# Patient Record
Sex: Female | Born: 1950 | Race: White | Hispanic: No | Smoking: Never smoker
Health system: Southern US, Community
[De-identification: ages and names within clinical notes are randomized; demographics above are authoritative.]

## PROBLEM LIST (undated history)

## (undated) DIAGNOSIS — F419 Anxiety disorder, unspecified: Secondary | ICD-10-CM

## (undated) DIAGNOSIS — I4891 Unspecified atrial fibrillation: Secondary | ICD-10-CM

## (undated) DIAGNOSIS — N200 Calculus of kidney: Secondary | ICD-10-CM

## (undated) DIAGNOSIS — K648 Other hemorrhoids: Secondary | ICD-10-CM

## (undated) DIAGNOSIS — D126 Benign neoplasm of colon, unspecified: Secondary | ICD-10-CM

## (undated) DIAGNOSIS — F341 Dysthymic disorder: Secondary | ICD-10-CM

## (undated) DIAGNOSIS — I10 Essential (primary) hypertension: Secondary | ICD-10-CM

## (undated) DIAGNOSIS — K5790 Diverticulosis of intestine, part unspecified, without perforation or abscess without bleeding: Secondary | ICD-10-CM

## (undated) DIAGNOSIS — K8020 Calculus of gallbladder without cholecystitis without obstruction: Secondary | ICD-10-CM

## (undated) DIAGNOSIS — E785 Hyperlipidemia, unspecified: Secondary | ICD-10-CM

## (undated) HISTORY — DX: Calculus of kidney: N20.0

## (undated) HISTORY — DX: Diverticulosis of intestine, part unspecified, without perforation or abscess without bleeding: K57.90

## (undated) HISTORY — DX: Hyperlipidemia, unspecified: E78.5

## (undated) HISTORY — PX: HERNIA REPAIR: SHX51

## (undated) HISTORY — DX: Dysthymic disorder: F34.1

## (undated) HISTORY — PX: CHOLECYSTECTOMY: SHX55

## (undated) HISTORY — PX: APPENDECTOMY: SHX54

## (undated) HISTORY — DX: Other hemorrhoids: K64.8

## (undated) HISTORY — DX: Calculus of gallbladder without cholecystitis without obstruction: K80.20

## (undated) HISTORY — DX: Anxiety disorder, unspecified: F41.9

## (undated) HISTORY — DX: Benign neoplasm of colon, unspecified: D12.6

---

## 1996-08-29 DIAGNOSIS — K8020 Calculus of gallbladder without cholecystitis without obstruction: Secondary | ICD-10-CM

## 1996-08-29 HISTORY — DX: Calculus of gallbladder without cholecystitis without obstruction: K80.20

## 2016-06-23 DIAGNOSIS — I10 Essential (primary) hypertension: Secondary | ICD-10-CM | POA: Diagnosis not present

## 2016-06-23 DIAGNOSIS — F419 Anxiety disorder, unspecified: Secondary | ICD-10-CM | POA: Diagnosis not present

## 2016-06-23 DIAGNOSIS — F331 Major depressive disorder, recurrent, moderate: Secondary | ICD-10-CM | POA: Diagnosis not present

## 2016-06-23 DIAGNOSIS — G4700 Insomnia, unspecified: Secondary | ICD-10-CM | POA: Diagnosis not present

## 2016-06-23 DIAGNOSIS — Z0000 Encounter for general adult medical examination without abnormal findings: Secondary | ICD-10-CM | POA: Diagnosis not present

## 2016-06-23 DIAGNOSIS — Z23 Encounter for immunization: Secondary | ICD-10-CM | POA: Diagnosis not present

## 2016-07-19 DIAGNOSIS — H5213 Myopia, bilateral: Secondary | ICD-10-CM | POA: Diagnosis not present

## 2016-10-24 DIAGNOSIS — I10 Essential (primary) hypertension: Secondary | ICD-10-CM | POA: Diagnosis not present

## 2016-10-24 DIAGNOSIS — E7800 Pure hypercholesterolemia, unspecified: Secondary | ICD-10-CM | POA: Diagnosis not present

## 2016-10-27 DIAGNOSIS — I4891 Unspecified atrial fibrillation: Secondary | ICD-10-CM | POA: Diagnosis not present

## 2016-10-27 DIAGNOSIS — Z6839 Body mass index (BMI) 39.0-39.9, adult: Secondary | ICD-10-CM | POA: Diagnosis not present

## 2016-10-27 DIAGNOSIS — I10 Essential (primary) hypertension: Secondary | ICD-10-CM | POA: Diagnosis not present

## 2016-11-07 DIAGNOSIS — I10 Essential (primary) hypertension: Secondary | ICD-10-CM | POA: Diagnosis not present

## 2016-11-07 DIAGNOSIS — E7800 Pure hypercholesterolemia, unspecified: Secondary | ICD-10-CM | POA: Diagnosis not present

## 2016-11-07 DIAGNOSIS — G4700 Insomnia, unspecified: Secondary | ICD-10-CM | POA: Diagnosis not present

## 2016-11-07 DIAGNOSIS — F331 Major depressive disorder, recurrent, moderate: Secondary | ICD-10-CM | POA: Diagnosis not present

## 2017-05-28 ENCOUNTER — Emergency Department (HOSPITAL_COMMUNITY)
Admission: EM | Admit: 2017-05-28 | Discharge: 2017-05-28 | Disposition: A | Discharge status: Home / Self Care | Payer: Medicare Other | Attending: Emergency Medicine | Admitting: Emergency Medicine

## 2017-05-28 ENCOUNTER — Encounter (HOSPITAL_COMMUNITY): Payer: Self-pay | Admitting: Emergency Medicine

## 2017-05-28 ENCOUNTER — Emergency Department (HOSPITAL_COMMUNITY): Payer: Medicare Other

## 2017-05-28 DIAGNOSIS — R1011 Right upper quadrant pain: Secondary | ICD-10-CM | POA: Insufficient documentation

## 2017-05-28 DIAGNOSIS — R1010 Upper abdominal pain, unspecified: Secondary | ICD-10-CM

## 2017-05-28 DIAGNOSIS — Z79899 Other long term (current) drug therapy: Secondary | ICD-10-CM | POA: Diagnosis not present

## 2017-05-28 DIAGNOSIS — K8020 Calculus of gallbladder without cholecystitis without obstruction: Secondary | ICD-10-CM | POA: Diagnosis not present

## 2017-05-28 DIAGNOSIS — I10 Essential (primary) hypertension: Secondary | ICD-10-CM | POA: Diagnosis not present

## 2017-05-28 HISTORY — DX: Unspecified atrial fibrillation: I48.91

## 2017-05-28 HISTORY — DX: Essential (primary) hypertension: I10

## 2017-05-28 LAB — CBC
HCT: 32.2 % — ABNORMAL LOW (ref 36.0–46.0)
Hemoglobin: 10.7 g/dL — ABNORMAL LOW (ref 12.0–15.0)
MCH: 30.9 pg (ref 26.0–34.0)
MCHC: 33.2 g/dL (ref 30.0–36.0)
MCV: 93.1 fL (ref 78.0–100.0)
PLATELETS: 454 10*3/uL — AB (ref 150–400)
RBC: 3.46 MIL/uL — ABNORMAL LOW (ref 3.87–5.11)
RDW: 12.5 % (ref 11.5–15.5)
WBC: 17.3 10*3/uL — AB (ref 4.0–10.5)

## 2017-05-28 LAB — COMPREHENSIVE METABOLIC PANEL
ALK PHOS: 116 U/L (ref 38–126)
ALT: 23 U/L (ref 14–54)
AST: 26 U/L (ref 15–41)
Albumin: 3.3 g/dL — ABNORMAL LOW (ref 3.5–5.0)
Anion gap: 13 (ref 5–15)
BUN: 20 mg/dL (ref 6–20)
CHLORIDE: 94 mmol/L — AB (ref 101–111)
CO2: 28 mmol/L (ref 22–32)
CREATININE: 1.57 mg/dL — AB (ref 0.44–1.00)
Calcium: 9.5 mg/dL (ref 8.9–10.3)
GFR calc Af Amer: 39 mL/min — ABNORMAL LOW (ref >60)
GFR, EST NON AFRICAN AMERICAN: 33 mL/min — AB (ref >60)
Glucose, Bld: 135 mg/dL — ABNORMAL HIGH (ref 65–99)
Potassium: 3.2 mmol/L — ABNORMAL LOW (ref 3.5–5.1)
Sodium: 135 mmol/L (ref 135–145)
Total Bilirubin: 0.8 mg/dL (ref 0.3–1.2)
Total Protein: 7.8 g/dL (ref 6.5–8.1)

## 2017-05-28 LAB — LIPASE, BLOOD: LIPASE: 30 U/L (ref 11–51)

## 2017-05-28 MED ORDER — ONDANSETRON 4 MG PO TBDP: 4.0000 mg | ORAL_TABLET | Freq: Three times a day (TID) | ORAL | 0 refills | Status: DC | PRN

## 2017-05-28 MED ORDER — HYDROCODONE-ACETAMINOPHEN 5-325 MG PO TABS: 1.0000 | ORAL_TABLET | ORAL | 0 refills | Status: DC | PRN

## 2017-05-28 MED ORDER — SODIUM CHLORIDE 0.9 % IV BOLUS (SEPSIS)
1000.0000 mL | Freq: Once | INTRAVENOUS | Status: AC
  Administered 2017-05-28: 1000 mL via INTRAVENOUS

## 2017-05-28 NOTE — ED Notes (Signed)
Pt returned to room  

## 2017-05-28 NOTE — ED Notes (Signed)
Patient transported to CT 

## 2017-05-28 NOTE — Discharge Instructions (Addendum)
Your CT and Ultrasound show a mass under the liver.  If your gallbladder WAS removed in 1998, this is concerning for possible malignancy.  If your gallbladder WASN'T removed in 1998, this could be a very abnormal appearing gallbladder.  The next appropriate test to further clarify this mass is an MRI of the abdomen. This has been ordered.  Please call your Primary Care Physician to make an appointment as soon as possible to discuss the results of this MRI.  If you become worse: Pain, fever, jaundice, vomiting, other symptoms, please return to emergency room.  You will need to have your medical records from the hospital in [place]. If they arrived tonight after you leave, we will contact you to pick them up. If they do not arrive you'll need to contact medical records at that hospital and request a discharge summary, and operative report.  Vicodin as needed for pain, Zofran as needed for nausea.  Bland low-fat diet.

## 2017-05-28 NOTE — ED Triage Notes (Signed)
Pt reports RUQ pain x4 days, went to urgent care and was sent in for further eval. Denies any associated symptoms, reports some fevers at home. Resp e/u, nad.

## 2017-05-28 NOTE — ED Notes (Signed)
Pt attempted to void..the patient says she voided but missed the cp... I told pt to let me know when she has to void again and we will catch her urine with a "hat"

## 2017-06-05 ENCOUNTER — Ambulatory Visit (HOSPITAL_COMMUNITY)
Admission: RE | Admit: 2017-06-05 | Discharge: 2017-06-05 | Disposition: A | Discharge status: Home / Self Care | Payer: Medicare Other | Source: Ambulatory Visit | Attending: Family Medicine | Admitting: Family Medicine

## 2017-06-05 ENCOUNTER — Encounter: Payer: Self-pay | Admitting: Family Medicine

## 2017-06-05 ENCOUNTER — Ambulatory Visit (INDEPENDENT_AMBULATORY_CARE_PROVIDER_SITE_OTHER): Payer: Medicare Other | Admitting: Family Medicine

## 2017-06-05 VITALS — BP 122/68 | HR 91 | Temp 98.3°F | Ht 66.0 in | Wt 205.8 lb

## 2017-06-05 DIAGNOSIS — I481 Persistent atrial fibrillation: Secondary | ICD-10-CM

## 2017-06-05 DIAGNOSIS — K862 Cyst of pancreas: Secondary | ICD-10-CM | POA: Insufficient documentation

## 2017-06-05 DIAGNOSIS — I10 Essential (primary) hypertension: Secondary | ICD-10-CM

## 2017-06-05 DIAGNOSIS — Z23 Encounter for immunization: Secondary | ICD-10-CM | POA: Diagnosis not present

## 2017-06-05 DIAGNOSIS — I4819 Other persistent atrial fibrillation: Secondary | ICD-10-CM

## 2017-06-05 DIAGNOSIS — R1011 Right upper quadrant pain: Secondary | ICD-10-CM

## 2017-06-05 DIAGNOSIS — D1803 Hemangioma of intra-abdominal structures: Secondary | ICD-10-CM | POA: Diagnosis not present

## 2017-06-05 DIAGNOSIS — E785 Hyperlipidemia, unspecified: Secondary | ICD-10-CM

## 2017-06-05 DIAGNOSIS — N281 Cyst of kidney, acquired: Secondary | ICD-10-CM | POA: Diagnosis not present

## 2017-06-05 DIAGNOSIS — K7689 Other specified diseases of liver: Secondary | ICD-10-CM | POA: Diagnosis not present

## 2017-06-05 DIAGNOSIS — R935 Abnormal findings on diagnostic imaging of other abdominal regions, including retroperitoneum: Secondary | ICD-10-CM | POA: Insufficient documentation

## 2017-06-05 DIAGNOSIS — F419 Anxiety disorder, unspecified: Secondary | ICD-10-CM

## 2017-06-05 DIAGNOSIS — Z9049 Acquired absence of other specified parts of digestive tract: Secondary | ICD-10-CM | POA: Diagnosis not present

## 2017-06-05 DIAGNOSIS — F341 Dysthymic disorder: Secondary | ICD-10-CM | POA: Insufficient documentation

## 2017-06-05 DIAGNOSIS — E782 Mixed hyperlipidemia: Secondary | ICD-10-CM | POA: Insufficient documentation

## 2017-06-05 HISTORY — DX: Dysthymic disorder: F34.1

## 2017-06-05 HISTORY — DX: Hyperlipidemia, unspecified: E78.5

## 2017-06-05 HISTORY — DX: Anxiety disorder, unspecified: F41.9

## 2017-06-05 LAB — COMPREHENSIVE METABOLIC PANEL
ALT: 16 U/L (ref 0–35)
AST: 15 U/L (ref 0–37)
Albumin: 3.7 g/dL (ref 3.5–5.2)
Alkaline Phosphatase: 113 U/L (ref 39–117)
BUN: 22 mg/dL (ref 6–23)
CO2: 32 mEq/L (ref 19–32)
Calcium: 9.9 mg/dL (ref 8.4–10.5)
Chloride: 91 mEq/L — ABNORMAL LOW (ref 96–112)
Creatinine, Ser: 1.21 mg/dL — ABNORMAL HIGH (ref 0.40–1.20)
GFR: 47.3 mL/min — ABNORMAL LOW (ref >60.00)
Glucose, Bld: 104 mg/dL — ABNORMAL HIGH (ref 70–99)
Potassium: 3.9 mEq/L (ref 3.5–5.1)
Sodium: 135 mEq/L (ref 135–145)
Total Bilirubin: 0.5 mg/dL (ref 0.2–1.2)
Total Protein: 7.6 g/dL (ref 6.0–8.3)

## 2017-06-05 LAB — CBC WITH DIFFERENTIAL/PLATELET
Basophils Absolute: 0 10*3/uL (ref 0.0–0.1)
Basophils Relative: 0.3 % (ref 0.0–3.0)
Eosinophils Absolute: 0.1 10*3/uL (ref 0.0–0.7)
Eosinophils Relative: 0.4 % (ref 0.0–5.0)
HCT: 32.3 % — ABNORMAL LOW (ref 36.0–46.0)
Hemoglobin: 11.2 g/dL — ABNORMAL LOW (ref 12.0–15.0)
Lymphocytes Relative: 14.1 % (ref 12.0–46.0)
Lymphs Abs: 2.2 10*3/uL (ref 0.7–4.0)
MCHC: 34.6 g/dL (ref 30.0–36.0)
MCV: 93.6 fl (ref 78.0–100.0)
Monocytes Absolute: 1.1 10*3/uL — ABNORMAL HIGH (ref 0.1–1.0)
Monocytes Relative: 7.2 % (ref 3.0–12.0)
Neutro Abs: 12.2 10*3/uL — ABNORMAL HIGH (ref 1.4–7.7)
Neutrophils Relative %: 78 % — ABNORMAL HIGH (ref 43.0–77.0)
Platelets: 631 10*3/uL — ABNORMAL HIGH (ref 150.0–400.0)
RBC: 3.45 Mil/uL — ABNORMAL LOW (ref 3.87–5.11)
RDW: 12.7 % (ref 11.5–15.5)
WBC: 15.6 10*3/uL — ABNORMAL HIGH (ref 4.0–10.5)

## 2017-06-05 LAB — SEDIMENTATION RATE: Sed Rate: 27 mm/hr (ref 0–30)

## 2017-06-05 LAB — C-REACTIVE PROTEIN: CRP: 18.5 mg/dL (ref 0.5–20.0)

## 2017-06-05 MED ORDER — GADOBENATE DIMEGLUMINE 529 MG/ML IV SOLN
20.0000 mL | Freq: Once | INTRAVENOUS | Status: AC | PRN
  Administered 2017-06-05: 19 mL via INTRAVENOUS

## 2017-06-05 MED ORDER — KLOR-CON M20 20 MEQ PO TBCR: 20.0000 meq | EXTENDED_RELEASE_TABLET | Freq: Every day | ORAL | 1 refills | Status: DC

## 2017-06-05 NOTE — Progress Notes (Signed)
Jordan Ware is a 66 y.o. female is here to [place].   Patient Care Team: Briscoe Deutscher, DO as PCP - General (Family Medicine)   History of Present Illness:   Jordan Ware, CMA, acting as scribe for Dr. Juleen Ware.  HPI:  Patient comes in today to establish care.  States she was recently seen in the ER for pain in her right upper quadrant.  A CT was done and patient was told the pain was due to her gallbladder, but patient states she had her gallbladder removed several years ago.  She was then told she needed an MRI performed because if she had her gallbladder removed then she may actually have a mass in her abdomen.  She was told to return the next morning for an MRI.  She states that she returned but was then turned away.  She states she was told there was something wrong with the MRI order and they could not perform the MRI.  They told patient to follow up with her PCP.  Patient also has atrial fibrillation.  She takes Bystolic, HCTZ, and Xarelto.  She had an EKG performed in the ED that showed she is currently in atrial fibrillation.    She states she has dizziness as well and has fallen in the past.  She states she has had injury before due to falls.  She states she spent a "month in a nursing home" due to a fall.  She decided when she fell that she would begin to try to get healthy.  Since that time she has lost 100 pounds.    She states that about a month ago she had an episode where half of her tongue went numb and her gums were very sore.  She states she took a lot of Advil for this.  The pain was constant.  States her tongue was swollen.  Then the symptoms resolved.    Patient takes Xanax to help with sleep.  The patient would like to get the flu shot today.  Health Maintenance Due  Topic Date Due  . Hepatitis C Screening  1950/11/01  . TETANUS/TDAP  04/13/1970  . MAMMOGRAM  04/13/2001  . COLONOSCOPY  04/13/2001  . DEXA SCAN  04/13/2016  . PNA vac Low Risk Adult (1  of 2 - PCV13) 04/13/2016   Depression screen PHQ 2/9 06/05/2017  Decreased Interest 0  Down, Depressed, Hopeless 0  PHQ - 2 Score 0   PMHx, SurgHx, SocialHx, Medications, and Allergies were reviewed in the Visit Navigator and updated as appropriate.   Past Medical History:  Diagnosis Date  . Anxiety 06/05/2017  . Atrial fibrillation ([place])   . Dysthymia 06/05/2017  . HLD (hyperlipidemia) 06/05/2017  . Hypertension    Past Surgical History:  Procedure Laterality Date  . APPENDECTOMY    . CHOLECYSTECTOMY     No family history on file.   Social History  Substance Use Topics  . Smoking status: Never Smoker  . Smokeless tobacco: Never Used  . Alcohol use No   Current Medications and Allergies:   .  ALPRAZolam (XANAX) 0.25 MG tablet, Take 0.25 mg by mouth 2 (two) times daily as needed., Disp: , Rfl: 2 .  BYSTOLIC 10 MG tablet, Take 5 mg by mouth daily., Disp: , Rfl: 1 .  diltiazem (CARDIZEM CD) 240 MG 24 hr capsule, Take 240 mg by mouth daily., Disp: , Rfl: 2 .  escitalopram (LEXAPRO) 20 MG tablet, Take 20 mg by mouth daily.,  Disp: , Rfl: 3 .  hydrochlorothiazide (HYDRODIURIL) 25 MG tablet, Take 25 mg by mouth daily., Disp: , Rfl: 2 .  KLOR-CON M20 20 MEQ tablet, Take 20 mEq by mouth daily., Disp: , Rfl: 1 .  rosuvastatin (CRESTOR) 10 MG tablet, Take 10 mg by mouth daily., Disp: , Rfl: 2 .  XARELTO 15 MG TABS tablet, Take 15 mg by mouth daily., Disp: , Rfl: 2  No Known Allergies   Review of Systems:   Pertinent items are noted in the HPI. Otherwise, ROS is negative.  Vitals:   Vitals:   06/05/17 1409  BP: 122/68  Pulse: 91  Temp: 98.3 F (36.8 C)  TempSrc: Oral  SpO2: 97%  Weight: 205 lb 12.8 oz (93.4 kg)  Height: 5\' 6"  (1.676 m)     Body mass index is 33.22 kg/m.   Physical Exam:   Physical Exam  Constitutional: She appears well-nourished.  HENT:  Head: Normocephalic and atraumatic.  Eyes: Pupils are equal, round, and reactive to light. EOM are normal.    Neck: Normal range of motion. Neck supple.  Cardiovascular: Normal rate, normal heart sounds and intact distal pulses.  An irregularly irregular rhythm present.  Pulmonary/Chest: Effort normal.  Abdominal: Soft. There is tenderness in the right upper quadrant.  Skin: Skin is warm.  Psychiatric: She has a normal mood and affect. Her behavior is normal.  Nursing note and vitals reviewed.  Results for orders placed or performed in visit on 06/05/17  CBC with Differential/Platelet  Result Value Ref Range   WBC 15.6 (H) 4.0 - 10.5 K/uL   RBC 3.45 (L) 3.87 - 5.11 Mil/uL   Hemoglobin 11.2 (L) 12.0 - 15.0 g/dL   HCT 32.3 (L) 36.0 - 46.0 %   MCV 93.6 78.0 - 100.0 fl   MCHC 34.6 30.0 - 36.0 g/dL   RDW 12.7 11.5 - 15.5 %   Platelets 631.0 (H) 150.0 - 400.0 K/uL   Neutrophils Relative % 78.0 (H) 43.0 - 77.0 %   Lymphocytes Relative 14.1 12.0 - 46.0 %   Monocytes Relative 7.2 3.0 - 12.0 %   Eosinophils Relative 0.4 0.0 - 5.0 %   Basophils Relative 0.3 0.0 - 3.0 %   Neutro Abs 12.2 (H) 1.4 - 7.7 K/uL   Lymphs Abs 2.2 0.7 - 4.0 K/uL   Monocytes Absolute 1.1 (H) 0.1 - 1.0 K/uL   Eosinophils Absolute 0.1 0.0 - 0.7 K/uL   Basophils Absolute 0.0 0.0 - 0.1 K/uL  Comprehensive metabolic panel  Result Value Ref Range   Sodium 135 135 - 145 mEq/L   Potassium 3.9 3.5 - 5.1 mEq/L   Chloride 91 (L) 96 - 112 mEq/L   CO2 32 19 - 32 mEq/L   Glucose, Bld 104 (H) 70 - 99 mg/dL   BUN 22 6 - 23 mg/dL   Creatinine, Ser 1.21 (H) 0.40 - 1.20 mg/dL   Total Bilirubin 0.5 0.2 - 1.2 mg/dL   Alkaline Phosphatase 113 39 - 117 U/L   AST 15 0 - 37 U/L   ALT 16 0 - 35 U/L   Total Protein 7.6 6.0 - 8.3 g/dL   Albumin 3.7 3.5 - 5.2 g/dL   Calcium 9.9 8.4 - 10.5 mg/dL   GFR 47.30 (L) >60.00 mL/min  Sedimentation rate  Result Value Ref Range   Sed Rate 27 0 - 30 mm/hr  C-reactive protein  Result Value Ref Range   CRP 18.5 0.5 - 20.0 mg/dL   Assessment and  Plan:   Austin was seen today for establish  care.  Diagnoses and all orders for this visit:  Right upper quadrant abdominal pain -     MR ABDOMEN W CONTRAST; Future -     MR Abdomen W Wo Contrast; Future -     CBC with Differential/Platelet -     Comprehensive metabolic panel -     Sedimentation rate -     C-reactive protein -     Ambulatory referral to General Surgery  Hyperlipidemia, unspecified hyperlipidemia type Comments:  Is the patient taking medications without problems? [x]   YES  []   NO Does the patient complain of muscle aches?   []   YES  [x]    NO Trying to exercise on a regular basis? []   YES  [x]   NO Diet Compliance: compliant most of the time. Concerns: none. Cardiovascular ROS: no chest pain or dyspnea on exertion.   Dysthymia Comments: Continue current treatment.   Anxiety Comments: Patient was given information on SSRIs and possible side effects were reviewed.  Orders: -     ALPRAZolam (XANAX) 0.25 MG tablet; Take 1 tablet (0.25 mg total) by mouth 2 (two) times daily as needed.  Need for immunization against influenza -     Flu Vaccine QUAD 36+ mos IM  Persistent atrial fibrillation ([place]) Comments: Continue Xarelto.  Essential hypertension Comments:  Avoiding excessive salt intake? []   YES  [x]   NO Trying to exercise on a regular basis? []   YES  [x]   NO Review: taking medications as instructed, no medication side effects noted, no TIAs, no chest pain on exertion, no dyspnea on exertion.   Wt Readings from Last 3 Encounters:  06/05/17 205 lb 12.8 oz (93.4 kg)    reports that she has never smoked. She has never used smokeless tobacco. BP Readings from Last 3 Encounters:  06/05/17 122/68  05/28/17 100/69   Lab Results  Component Value Date   CREATININE 1.21 (H) 06/05/2017    . Reviewed expectations re: course of current medical issues. . Discussed self-management of symptoms. . Outlined signs and symptoms indicating need for more acute intervention. . Patient verbalized understanding  and all questions were answered. Marland Kitchen Health Maintenance issues including appropriate healthy diet, exercise, and smoking avoidance were discussed with patient. . See orders for this visit as documented in the electronic medical record. . Patient received an After Visit Summary.  CMA served as Education administrator during this visit. History, Physical, and Plan performed by medical provider. The above documentation has been reviewed and is accurate and complete. Briscoe Deutscher, D.O.  Briscoe Deutscher, DO [place], [place] 06/06/2017

## 2017-06-06 ENCOUNTER — Encounter: Payer: Self-pay | Admitting: Family Medicine

## 2017-06-06 MED ORDER — ALPRAZOLAM 0.25 MG PO TABS: 0.2500 mg | ORAL_TABLET | Freq: Two times a day (BID) | ORAL | 2 refills | Status: DC | PRN

## 2017-06-06 NOTE — ED Provider Notes (Signed)
[facility] DEPT Provider Note   CSN: [id number] Arrival date & time: 05/28/17  1136     History   Chief Complaint Chief Complaint  Patient presents with  . Abdominal Pain    HPI Jordan Ware is a 66 y.o. female. CC:  RUQ pain  HPI:  66 year old female. Describes right upper quadrant and epigastric abdominal pain for the last 4 days. Was at urgent care and sent here for further evaluation. She states that time she has felt hot and cold but has not documented temperature at home. She states she feels full after meal. Her appetite has been poor but has had no nausea or vomiting. Normal bowel habits. Patient reports history of cholecystectomy in 1998 in [place].  States she was hospitalized for several days. Head midline incision. I also describes undergoing ERCP for common bile duct stone at that time.  Past Medical History:  Diagnosis Date  . Atrial fibrillation ([place])   . Hypertension     Patient Active Problem List   Diagnosis Date Noted  . HLD (hyperlipidemia) 06/05/2017  . Depression 06/05/2017  . Anxiety 06/05/2017  . RUQ abdominal pain 06/05/2017    Past Surgical History:  Procedure Laterality Date  . APPENDECTOMY    . CHOLECYSTECTOMY      OB History    No data available       Home Medications    Prior to Admission medications   Medication Sig Start Date End Date Taking? Authorizing Provider  ALPRAZolam (XANAX) 0.25 MG tablet Take 0.25 mg by mouth 2 (two) times daily as needed. 04/13/17   [provider]  BYSTOLIC 10 MG tablet Take 5 mg by mouth daily. 04/12/17   [provider]  diltiazem (CARDIZEM CD) 240 MG 24 hr capsule Take 240 mg by mouth daily. 05/21/17   [provider]  escitalopram (LEXAPRO) 20 MG tablet Take 20 mg by mouth daily. 03/25/17   [provider]  hydrochlorothiazide (HYDRODIURIL) 25 MG tablet Take 25 mg by mouth daily. 03/25/17   [provider]  KLOR-CON M20 20 MEQ tablet Take 1 tablet  (20 mEq total) by mouth daily. 06/05/17   Briscoe Deutscher, DO  rosuvastatin (CRESTOR) 10 MG tablet Take 10 mg by mouth daily. 03/25/17   [provider]  XARELTO 15 MG TABS tablet Take 15 mg by mouth daily. 05/22/17   [provider]    Family History No family history on file.  Social History Social History  Substance Use Topics  . Smoking status: Never Smoker  . Smokeless tobacco: Never Used  . Alcohol use No     Allergies   Patient has no known allergies.   Review of Systems Review of Systems  Constitutional: Positive for appetite change. Negative for chills, diaphoresis, fatigue and fever.  HENT: Negative for mouth sores, sore throat and trouble swallowing.   Eyes: Negative for visual disturbance.  Respiratory: Negative for cough, chest tightness, shortness of breath and wheezing.   Cardiovascular: Negative for chest pain.  Gastrointestinal: Positive for abdominal pain. Negative for abdominal distention, diarrhea, nausea and vomiting.       Early satiety  Endocrine: Negative for polydipsia, polyphagia and polyuria.  Genitourinary: Negative for dysuria, frequency and hematuria.  Musculoskeletal: Negative for gait problem.  Skin: Negative for color change, pallor and rash.  Neurological: Negative for dizziness, syncope, light-headedness and headaches.  Hematological: Does not bruise/bleed easily.  Psychiatric/Behavioral: Negative for behavioral problems and confusion.     Physical Exam Updated Vital Signs  BP 100/69 (BP Location: Right Arm)   Pulse 65   Temp 98.4 F (36.9 C) (Oral)   Resp 18   SpO2 96%   Physical Exam  Constitutional: She is oriented to person, place, and time. She appears well-developed and well-nourished. No distress.  HENT:  Head: Normocephalic.  Eyes: Pupils are equal, round, and reactive to light. Conjunctivae are normal. No scleral icterus.  Neck: Normal range of motion. Neck supple. No thyromegaly present.  Cardiovascular:  Normal rate and regular rhythm.  Exam reveals no gallop and no friction rub.   No murmur heard. Pulmonary/Chest: Effort normal and breath sounds normal. No respiratory distress. She has no wheezes. She has no rales.  Abdominal: Soft. Bowel sounds are normal. She exhibits no distension. There is no tenderness. There is no rebound.  Tenderness in RUQ. Neg murphy's sign. No peritoneal irritation, no pain with cough.  Musculoskeletal: Normal range of motion.  Neurological: She is alert and oriented to person, place, and time.  Skin: Skin is warm and dry. No rash noted.  Psychiatric: She has a normal mood and affect. Her behavior is normal.     ED Treatments / Results  Labs (all labs ordered are listed, but only abnormal results are displayed) Labs Reviewed  COMPREHENSIVE METABOLIC PANEL - Abnormal; Notable for the following:       Result Value   Potassium 3.2 (*)    Chloride 94 (*)    Glucose, Bld 135 (*)    Creatinine, Ser 1.57 (*)    Albumin 3.3 (*)    GFR calc non Af Amer 33 (*)    GFR calc Af Amer 39 (*)    All other components within normal limits  CBC - Abnormal; Notable for the following:    WBC 17.3 (*)    RBC 3.46 (*)    Hemoglobin 10.7 (*)    HCT 32.2 (*)    Platelets 454 (*)    All other components within normal limits  LIPASE, BLOOD    EKG  EKG Interpretation  Date/Time:  Sunday May 28 2017 11:53:49 EDT Ventricular Rate:  100 PR Interval:    QRS Duration: 84 QT Interval:  372 QTC Calculation: 479 R Axis:   66 Text Interpretation:  Atrial fibrillation Low voltage QRS ST & T wave abnormality, consider inferior ischemia ST & T wave abnormality, consider anterolateral ischemia No previous tracing Confirmed by Blanchie Dessert [phone]) on 05/30/2017 12:32:00 PM       Radiology Mr Abdomen W Wo Contrast  Result Date: 06/05/2017 CLINICAL DATA:  Evaluate gallbladder fossa process seen on recent CT scan and ultrasound. Patient apparently had a cholecystectomy  in 1998. EXAM: MRI ABDOMEN WITHOUT AND WITH CONTRAST TECHNIQUE: Multiplanar multisequence MR imaging of the abdomen was performed both before and after the administration of intravenous contrast. CONTRAST:  20 cc MultiHance COMPARISON:  CT scan 05/28/2017 and ultrasound 05/06/2017 FINDINGS: Lower chest: The lung bases clear of acute process. Minimal dependent atelectasis. No pleural or pericardial effusion. Hepatobiliary: There are simple appearing hepatic cysts and probable small hemangiomas. There is a large, 9.1 x 6.4 cm, complex cystic structure in the gallbladder fossa/periportal region with serpiginous dilated fluid-filled components with surrounding enhancement/ inflammation. There are also small and large calculi present. No common bile duct dilatation. If the patient has in fact had a cholecystectomy this must be large dilated cystic duct remnant and gallbladder remnant which has become dilated and obstructed and markedly inflamed. There is some surrounding inflammation in the liver  parenchyma but this does not have the appearance of a hepatic mass. It is separate from the duodenum and separate from the colon. Pancreas: No mass, inflammation or ductal dilatation. There is a small, 6 mm, cystic lesion noted in the pancreatic tail. This is likely a benign postinflammatory cyst but will require followup as the possibility of a IPMN is in the differential diagnosis. Spleen:  Normal size.  Small splenic cysts are noted. Adrenals/Urinary Tract: The adrenal glands and kidneys are unremarkable. Small renal cysts are noted. Stomach/Bowel: The stomach, duodenum, visualized small bowel and visualize colon are unremarkable. Vascular/Lymphatic: The aorta and branch vessels are patent. The major venous structures are patent. No mesenteric or retroperitoneal mass or adenopathy. Other:  No ascites or abdominal wall hernia. Musculoskeletal: No significant bony findings. IMPRESSION: 1. Large complex inflamed cystic structure  in the gallbladder fossa containing calculi is most likely a cystic duct remnant and gallbladder remnant since the patient has a history of a prior cholecystectomy. This does not have the MR appearance of a liver mass. 2. Small hepatic cysts and hemangiomas. 3. Normal caliber and course of the common bile duct and normal pancreas and pancreatic duct. 4. 6 mm cyst in the pancreatic tail, recommend followup MR examination in 12 months. Electronically Signed   By: Marijo Sanes M.D.   On: 06/05/2017 20:40    Procedures Procedures (including critical care time)  Medications Ordered in ED Medications  sodium chloride 0.9 % bolus 1,000 mL (0 mLs Intravenous Stopped 05/28/17 1736)     Initial Impression / Assessment and Plan / ED Course  I have reviewed the triage vital signs and the nursing notes.  Pertinent labs & imaging results that were available during my care of the patient were reviewed by me and considered in my medical decision making (see chart for details).    Patient has leukocytosis. Does not have fever. Hepatobiliary enzymes are normal. Right upper quadrant ultrasound described as distended gallbladder with markedly thickened wall measuring up to 21 mm without Murphy sign. Multiple stones. No pericholecystic fluid.  With history of cholecystectomy, [place] unusual read. CT was obtained. Read as a large gallbladder with surrounding inflammation and multiple stones and large calculus.  I discussed the case with general surgery, Dr. Rosendo Gros. He felt that this likely represented choledocholithiasis. I discussed this with patient. Her symptoms are well-controlled at 107 IV pain medication. Outpatient MRI of her abdomen to further delineate this abnormal area. Discussed with her that if she redevelops pain, fever, vomiting, or other signs that she should return to the emergency room for further evaluation.   Final Clinical Impressions(s) / ED Diagnoses   Final diagnoses:  Upper abdominal  pain  Right upper quadrant abdominal pain    New Prescriptions Discharge Medication List as of 05/29/2017  8:15 AM    START taking these medications   Details  HYDROcodone-acetaminophen (NORCO/VICODIN) 5-325 MG tablet Take 1 tablet by mouth every 4 (four) hours as needed., Starting Sun 05/28/2017, Print    ondansetron (ZOFRAN ODT) 4 MG disintegrating tablet Take 1 tablet (4 mg total) by mouth every 8 (eight) hours as needed for nausea., Starting Sun 05/28/2017, Print         Tanna Furry, MD 06/06/17 [phone]

## 2017-06-09 ENCOUNTER — Telehealth: Payer: Self-pay | Admitting: Family Medicine

## 2017-06-09 NOTE — Telephone Encounter (Signed)
Gave patient results. Please see result note.

## 2017-06-09 NOTE — Telephone Encounter (Signed)
Patient stated she was returning a call for lab results that was from yesterday. Call patient to advise, okay to leave a detailed message.

## 2017-06-11 ENCOUNTER — Encounter: Payer: Self-pay | Admitting: Family Medicine

## 2017-06-11 DIAGNOSIS — N1832 Chronic kidney disease, stage 3b: Secondary | ICD-10-CM | POA: Insufficient documentation

## 2017-06-11 DIAGNOSIS — N183 Chronic kidney disease, stage 3 unspecified: Secondary | ICD-10-CM | POA: Insufficient documentation

## 2017-06-11 DIAGNOSIS — R7989 Other specified abnormal findings of blood chemistry: Secondary | ICD-10-CM | POA: Insufficient documentation

## 2017-06-11 DIAGNOSIS — R935 Abnormal findings on diagnostic imaging of other abdominal regions, including retroperitoneum: Secondary | ICD-10-CM | POA: Insufficient documentation

## 2017-06-13 ENCOUNTER — Ambulatory Visit: Payer: Self-pay | Admitting: Family Medicine

## 2017-06-14 ENCOUNTER — Telehealth: Payer: Self-pay | Admitting: Family Medicine

## 2017-06-14 NOTE — Telephone Encounter (Signed)
ROI faxed to Jola Babinski PA

## 2017-06-21 ENCOUNTER — Other Ambulatory Visit: Payer: Self-pay | Admitting: Surgery

## 2017-06-21 DIAGNOSIS — K819 Cholecystitis, unspecified: Secondary | ICD-10-CM | POA: Diagnosis not present

## 2017-07-18 DIAGNOSIS — R932 Abnormal findings on diagnostic imaging of liver and biliary tract: Secondary | ICD-10-CM | POA: Diagnosis not present

## 2017-07-26 ENCOUNTER — Other Ambulatory Visit: Payer: Self-pay | Admitting: General Surgery

## 2017-07-26 DIAGNOSIS — R932 Abnormal findings on diagnostic imaging of liver and biliary tract: Secondary | ICD-10-CM

## 2017-08-03 ENCOUNTER — Ambulatory Visit
Admission: RE | Admit: 2017-08-03 | Discharge: 2017-08-03 | Disposition: A | Discharge status: Home / Self Care | Payer: Medicare Other | Source: Ambulatory Visit | Attending: General Surgery | Admitting: General Surgery

## 2017-08-03 DIAGNOSIS — R1011 Right upper quadrant pain: Secondary | ICD-10-CM | POA: Diagnosis not present

## 2017-08-03 DIAGNOSIS — R932 Abnormal findings on diagnostic imaging of liver and biliary tract: Secondary | ICD-10-CM

## 2017-08-03 MED ORDER — GADOBENATE DIMEGLUMINE 529 MG/ML IV SOLN
10.0000 mL | Freq: Once | INTRAVENOUS | Status: AC | PRN
  Administered 2017-08-03: 10 mL via INTRAVENOUS

## 2017-08-04 ENCOUNTER — Other Ambulatory Visit: Payer: Medicare Other

## 2017-08-31 ENCOUNTER — Other Ambulatory Visit: Payer: Self-pay | Admitting: Family Medicine

## 2017-08-31 DIAGNOSIS — F419 Anxiety disorder, unspecified: Secondary | ICD-10-CM

## 2017-08-31 NOTE — Telephone Encounter (Signed)
Please advise on refill.

## 2017-09-01 NOTE — Telephone Encounter (Signed)
Okay refill but make sure that she has f/u within the next 2-3 months.

## 2017-09-01 NOTE — Telephone Encounter (Signed)
Left message for patient to return call.

## 2017-09-04 ENCOUNTER — Telehealth: Payer: Self-pay | Admitting: Family Medicine

## 2017-09-04 DIAGNOSIS — R932 Abnormal findings on diagnostic imaging of liver and biliary tract: Secondary | ICD-10-CM | POA: Diagnosis not present

## 2017-09-04 NOTE — Telephone Encounter (Signed)
Copied from [place] [id number]. Topic: Quick Communication - Rx Refill/Question >> Sep 04, 2017  2:40 PM Patrice Paradise wrote: Has the patient contacted their pharmacy? Yes.   ALPRAZolam (XANAX) 0.25 MG tablet (REQUEST SENT OVER BY THE PHARMACIST ON THE 3RD & 4TH)  [id number] [address], [place] - [address] [place] [address] Phone: [phone] Fax: [phone]  erred Pharmacy (with phone number or street name):   Agent: Please be advised that RX refills may take up to 3 business days. We ask that you follow-up with your pharmacy.

## 2017-09-04 NOTE — Telephone Encounter (Signed)
Refill request

## 2017-09-04 NOTE — Telephone Encounter (Signed)
Left message to return call to our office.  

## 2017-09-04 NOTE — Telephone Encounter (Signed)
Please be advised of medication refill below.

## 2017-09-04 NOTE — Telephone Encounter (Signed)
Please contact patient if a call was made.   Copied from [place] [phone]. Topic: Inquiry >> Sep 04, 2017  1:38 PM Neva Seat wrote: Pt returned missed call.  Please call pt back at home number.

## 2017-09-04 NOTE — Telephone Encounter (Signed)
Okay refill x 1. Make sure she knows to keep appointment.

## 2017-09-04 NOTE — Telephone Encounter (Signed)
Patient has follow up with you on 09/12/17

## 2017-09-05 ENCOUNTER — Other Ambulatory Visit: Payer: Self-pay

## 2017-09-05 DIAGNOSIS — F419 Anxiety disorder, unspecified: Secondary | ICD-10-CM

## 2017-09-05 MED ORDER — ALPRAZOLAM 0.25 MG PO TABS: 0.2500 mg | ORAL_TABLET | Freq: Two times a day (BID) | ORAL | 0 refills | Status: DC | PRN

## 2017-09-05 NOTE — Telephone Encounter (Signed)
Called patient confirmed app refill faxed to pharmacy.

## 2017-09-05 NOTE — Telephone Encounter (Signed)
Patient is scheduled to come in on 09/11/17.

## 2017-09-12 ENCOUNTER — Encounter: Payer: Self-pay | Admitting: Family Medicine

## 2017-09-12 ENCOUNTER — Ambulatory Visit (INDEPENDENT_AMBULATORY_CARE_PROVIDER_SITE_OTHER): Payer: Medicare Other | Admitting: Family Medicine

## 2017-09-12 VITALS — BP 120/66 | HR 71 | Temp 98.5°F | Wt 207.8 lb

## 2017-09-12 DIAGNOSIS — I10 Essential (primary) hypertension: Secondary | ICD-10-CM | POA: Diagnosis not present

## 2017-09-12 DIAGNOSIS — F419 Anxiety disorder, unspecified: Secondary | ICD-10-CM | POA: Diagnosis not present

## 2017-09-12 DIAGNOSIS — F341 Dysthymic disorder: Secondary | ICD-10-CM

## 2017-09-12 DIAGNOSIS — Z1231 Encounter for screening mammogram for malignant neoplasm of breast: Secondary | ICD-10-CM | POA: Diagnosis not present

## 2017-09-12 DIAGNOSIS — Z1239 Encounter for other screening for malignant neoplasm of breast: Secondary | ICD-10-CM

## 2017-09-12 DIAGNOSIS — I481 Persistent atrial fibrillation: Secondary | ICD-10-CM

## 2017-09-12 DIAGNOSIS — I4819 Other persistent atrial fibrillation: Secondary | ICD-10-CM

## 2017-09-12 DIAGNOSIS — E2839 Other primary ovarian failure: Secondary | ICD-10-CM | POA: Diagnosis not present

## 2017-09-12 MED ORDER — HYDROCHLOROTHIAZIDE 25 MG PO TABS: 25.0000 mg | ORAL_TABLET | Freq: Every day | ORAL | 2 refills | Status: DC

## 2017-09-12 MED ORDER — KLOR-CON M20 20 MEQ PO TBCR: 20.0000 meq | EXTENDED_RELEASE_TABLET | Freq: Every day | ORAL | 1 refills | Status: DC

## 2017-09-12 MED ORDER — XARELTO 15 MG PO TABS: 15.0000 mg | ORAL_TABLET | Freq: Every day | ORAL | 2 refills | Status: DC

## 2017-09-12 MED ORDER — ESCITALOPRAM OXALATE 20 MG PO TABS: 20.0000 mg | ORAL_TABLET | Freq: Every day | ORAL | 3 refills | Status: DC

## 2017-09-12 MED ORDER — DILTIAZEM HCL ER COATED BEADS 120 MG PO CP24: 240.0000 mg | ORAL_CAPSULE | Freq: Every day | ORAL | 0 refills | Status: DC

## 2017-09-12 MED ORDER — ALPRAZOLAM 1 MG PO TABS: 1.0000 mg | ORAL_TABLET | Freq: Every day | ORAL | 0 refills | Status: DC

## 2017-09-14 ENCOUNTER — Other Ambulatory Visit: Payer: Self-pay | Admitting: Family Medicine

## 2017-09-14 DIAGNOSIS — I4819 Other persistent atrial fibrillation: Secondary | ICD-10-CM

## 2017-09-17 NOTE — Progress Notes (Signed)
Jordan Ware is a 68 y.o. female is here for follow up.  History of Present Illness:   HPI: See Assessment and Plan section for Problem Based Charting of issues discussed today.  Health Maintenance Due  Topic Date Due  . Hepatitis C Screening  03/03/1951  . TETANUS/TDAP  04/13/1970  . MAMMOGRAM  04/13/2001  . COLONOSCOPY  04/13/2001  . DEXA SCAN  04/13/2016  . PNA vac Low Risk Adult (1 of 2 - PCV13) 04/13/2016   Depression screen PHQ 2/9 06/05/2017  Decreased Interest 0  Down, Depressed, Hopeless 0  PHQ - 2 Score 0   PMHx, SurgHx, SocialHx, FamHx, Medications, and Allergies were reviewed in the Visit Navigator and updated as appropriate.   Patient Active Problem List   Diagnosis Date Noted  . Elevated serum creatinine 06/11/2017  . Abnormal MRI of abdomen 06/11/2017  . HLD (hyperlipidemia) 06/05/2017  . Dysthymia 06/05/2017  . Anxiety 06/05/2017  . Right upper quadrant pain 06/05/2017   Social History   Tobacco Use  . Smoking status: Never Smoker  . Smokeless tobacco: Never Used  Substance Use Topics  . Alcohol use: No  . Drug use: No   Current Medications and Allergies:   .  ALPRAZolam (XANAX) 1 MG tablet, Take 1 tablet (1 mg total) by mouth at bedtime., Disp: 90 tablet, Rfl: 0 .  diltiazem (CARDIZEM CD) 120 MG 24 hr capsule, Take 2 capsules (240 mg total) by mouth daily., Disp: 90 capsule, Rfl: 0 .  escitalopram (LEXAPRO) 20 MG tablet, Take 1 tablet (20 mg total) by mouth daily., Disp: 90 tablet, Rfl: 3 .  hydrochlorothiazide (HYDRODIURIL) 25 MG tablet, Take 1 tablet (25 mg total) by mouth daily., Disp: 90 tablet, Rfl: 2 .  KLOR-CON M20 20 MEQ tablet, Take 1 tablet (20 mEq total) by mouth daily., Disp: 90 tablet, Rfl: 1 .  XARELTO 15 MG TABS tablet, Take 1 tablet (15 mg total) by mouth daily., Disp: 42 tablet, Rfl: 2 .  BYSTOLIC 10 MG tablet, TAKE 1 TABLET BY MOUTH EVERY DAY, Disp: 90 tablet, Rfl: 1   Allergies  Allergen Reactions  . Crestor  [Rosuvastatin] Other (See Comments)    Leg pain    Review of Systems   Pertinent items are noted in the HPI. Otherwise, ROS is negative.  Vitals:   Vitals:   09/12/17 1344  BP: 120/66  Pulse: 71  Temp: 98.5 F (36.9 C)  TempSrc: Oral  SpO2: 98%  Weight: 207 lb 12.8 oz (94.3 kg)     Body mass index is 33.54 kg/m.   Physical Exam:   Physical Exam  Constitutional: She is oriented to person, place, and time. She appears well-developed and well-nourished. No distress.  HENT:  Head: Normocephalic and atraumatic.  Right Ear: External ear normal.  Left Ear: External ear normal.  Nose: Nose normal.  Mouth/Throat: Oropharynx is clear and moist.  Eyes: Conjunctivae and EOM are normal. Pupils are equal, round, and reactive to light.  Neck: Normal range of motion. Neck supple. No thyromegaly present.  Cardiovascular: Normal rate, regular rhythm, normal heart sounds and intact distal pulses.  Pulmonary/Chest: Effort normal and breath sounds normal.  Abdominal: Soft. Bowel sounds are normal.  Musculoskeletal: Normal range of motion.  Lymphadenopathy:    She has no cervical adenopathy.  Neurological: She is alert and oriented to person, place, and time.  Skin: Skin is warm and dry. Capillary refill takes less than 2 seconds.  Psychiatric: She has a normal mood and  affect. Her behavior is normal.  Nursing note and vitals reviewed.  Assessment and Plan:   Jordan Ware was seen today for follow-up.  Diagnoses and all orders for this visit:  Anxiety Comments: Using at night only. Works well. No side-effects. Usually uses 0.25 - 0.75 mg at a time.  Orders: -     ALPRAZolam (XANAX) 1 MG tablet; Take 1 tablet (1 mg total) by mouth at bedtime.  Screening for breast cancer -     MM SCREENING BREAST TOMO BILATERAL; Future  Estrogen deficiency -     DG Bone Density; Future  Persistent atrial fibrillation (HCC) Comments: Well controlled.  No signs of complications, medication side  effects, or red flags.  Continue current regimen.   Orders: -     diltiazem (CARDIZEM CD) 120 MG 24 hr capsule; Take 2 capsules (240 mg total) by mouth daily. -     XARELTO 15 MG TABS tablet; Take 1 tablet (15 mg total) by mouth daily.  Dysthymia Comments:  Treatment to date has included Medication.  Symptoms have been gradually improving since onset of treatment.  Side effects from the treatment include: none.   Alcohol use: no.  Drug use: no.  Exercise: yes.   Patient denies current suicidal and homicidal ideation.  Orders: -     escitalopram (LEXAPRO) 20 MG tablet; Take 1 tablet (20 mg total) by mouth daily.  Essential hypertension Comments: Avoiding excessive salt intake? [x]   YES  []   NO Trying to exercise on a regular basis? []   YES  [x]   NO Review: taking medications as instructed, no medication side effects noted, no TIAs, no chest pain on exertion, no dyspnea on exertion, no swelling of ankles.  Smoker: No.  Wt Readings from Last 3 Encounters:  09/12/17 207 lb 12.8 oz (94.3 kg)  06/05/17 205 lb 12.8 oz (93.4 kg)   BP Readings from Last 3 Encounters:  09/12/17 120/66  06/05/17 122/68  05/28/17 100/69   Lab Results  Component Value Date   CREATININE 1.21 (H) 06/05/2017   Orders: -     hydrochlorothiazide (HYDRODIURIL) 25 MG tablet; Take 1 tablet (25 mg total) by mouth daily. -     KLOR-CON M20 20 MEQ tablet; Take 1 tablet (20 mEq total) by mouth daily.   . Reviewed expectations re: course of current medical issues. . Discussed self-management of symptoms. . Outlined signs and symptoms indicating need for more acute intervention. . Patient verbalized understanding and all questions were answered. Marland Kitchen Health Maintenance issues including appropriate healthy diet, exercise, and smoking avoidance were discussed with patient. . See orders for this visit as documented in the electronic medical record. . Patient received an After Visit Summary.  Briscoe Deutscher,  DO [place] 09/17/2017  Future Appointments  Date Time Provider [place]  11/28/2017  1:30 PM [facility] DX DEXA 1 [facility]  11/28/2017  2:00 PM [facility] MM 2 [facility]

## 2017-11-15 ENCOUNTER — Other Ambulatory Visit: Payer: Self-pay | Admitting: Family Medicine

## 2017-11-28 ENCOUNTER — Ambulatory Visit
Admission: RE | Admit: 2017-11-28 | Discharge: 2017-11-28 | Disposition: A | Discharge status: Home / Self Care | Payer: Medicare Other | Source: Ambulatory Visit | Attending: Family Medicine | Admitting: Family Medicine

## 2017-11-28 DIAGNOSIS — Z13820 Encounter for screening for osteoporosis: Secondary | ICD-10-CM | POA: Diagnosis not present

## 2017-11-28 DIAGNOSIS — Z1239 Encounter for other screening for malignant neoplasm of breast: Secondary | ICD-10-CM

## 2017-11-28 DIAGNOSIS — E2839 Other primary ovarian failure: Secondary | ICD-10-CM

## 2017-11-28 DIAGNOSIS — Z1231 Encounter for screening mammogram for malignant neoplasm of breast: Secondary | ICD-10-CM | POA: Diagnosis not present

## 2017-12-10 ENCOUNTER — Other Ambulatory Visit: Payer: Self-pay | Admitting: Family Medicine

## 2017-12-10 DIAGNOSIS — I4819 Other persistent atrial fibrillation: Secondary | ICD-10-CM

## 2017-12-17 ENCOUNTER — Other Ambulatory Visit: Payer: Self-pay | Admitting: Family Medicine

## 2017-12-17 DIAGNOSIS — I10 Essential (primary) hypertension: Secondary | ICD-10-CM

## 2018-01-01 ENCOUNTER — Other Ambulatory Visit: Payer: Self-pay | Admitting: Family Medicine

## 2018-01-01 DIAGNOSIS — F419 Anxiety disorder, unspecified: Secondary | ICD-10-CM

## 2018-01-01 NOTE — Telephone Encounter (Signed)
Please advise on refill.

## 2018-01-04 ENCOUNTER — Other Ambulatory Visit: Payer: Self-pay | Admitting: Family Medicine

## 2018-01-04 DIAGNOSIS — F419 Anxiety disorder, unspecified: Secondary | ICD-10-CM

## 2018-01-10 ENCOUNTER — Ambulatory Visit (INDEPENDENT_AMBULATORY_CARE_PROVIDER_SITE_OTHER): Payer: Medicare Other | Admitting: Family Medicine

## 2018-01-10 ENCOUNTER — Encounter: Payer: Self-pay | Admitting: Family Medicine

## 2018-01-10 VITALS — BP 124/82 | Temp 97.8°F | Ht 66.0 in | Wt 218.4 lb

## 2018-01-10 DIAGNOSIS — R7989 Other specified abnormal findings of blood chemistry: Secondary | ICD-10-CM

## 2018-01-10 DIAGNOSIS — Z1211 Encounter for screening for malignant neoplasm of colon: Secondary | ICD-10-CM | POA: Diagnosis not present

## 2018-01-10 DIAGNOSIS — M79606 Pain in leg, unspecified: Secondary | ICD-10-CM

## 2018-01-10 DIAGNOSIS — E781 Pure hyperglyceridemia: Secondary | ICD-10-CM | POA: Diagnosis not present

## 2018-01-10 DIAGNOSIS — F419 Anxiety disorder, unspecified: Secondary | ICD-10-CM | POA: Diagnosis not present

## 2018-01-10 DIAGNOSIS — Z23 Encounter for immunization: Secondary | ICD-10-CM | POA: Diagnosis not present

## 2018-01-10 MED ORDER — ALPRAZOLAM 1 MG PO TABS: 1.0000 mg | ORAL_TABLET | Freq: Every day | ORAL | 0 refills | Status: DC

## 2018-01-10 NOTE — Progress Notes (Signed)
Jordan Ware is a 67 y.o. female is here for follow up.  History of Present Illness:  Shaune Pascal CMA acting as scribe for Dr. Juleen China.  HPI: Patient comes in today for a medication refill on her Xanax. She is also having some leg heaviness that has been for years. When she stopped taking the Crestor it did help some.   1. Anxiety. Taking medication nightly. No concerns.   2. Pure hyperglyceridemia. Not on statin due to myalgias.    3. Elevated serum creatinine. Due for recheck.    4. Screening for colon cancer. Ready.   5. Need for prophylactic vaccination against Streptococcus pneumoniae (pneumococcus). Agreed to.   6. Pain of lower extremity, bilateral. Calf pain when walking. Better when she stops. Worse with steps. No n/t, or weakness. No edema or redness.    Health Maintenance Due  Topic Date Due  . Hepatitis C Screening  [birth year]  . TETANUS/TDAP  04/13/1970  . COLONOSCOPY  04/13/2001   Depression screen PHQ 2/9 06/05/2017  Decreased Interest 0  Down, Depressed, Hopeless 0  PHQ - 2 Score 0   PMHx, SurgHx, SocialHx, FamHx, Medications, and Allergies were reviewed in the Visit Navigator and updated as appropriate.   Patient Active Problem List   Diagnosis Date Noted  . Elevated serum creatinine 06/11/2017  . Abnormal MRI of abdomen 06/11/2017  . HLD (hyperlipidemia) 06/05/2017  . Dysthymia 06/05/2017  . Anxiety 06/05/2017  . Right upper quadrant pain 06/05/2017   Social History   Tobacco Use  . Smoking status: Never Smoker  . Smokeless tobacco: Never Used  Substance Use Topics  . Alcohol use: No  . Drug use: No   Current Medications and Allergies:   .  ALPRAZolam (XANAX) 1 MG tablet, Take 1 tablet (1 mg total) by mouth at bedtime., Disp: 90 tablet, Rfl: 0 .  BYSTOLIC 10 MG tablet, TAKE 1 TABLET BY MOUTH EVERY DAY, Disp: 90 tablet, Rfl: 1 .  diltiazem (CARDIZEM CD) 120 MG 24 hr capsule, TAKE 2 CAPSULES (240 MG TOTAL) BY MOUTH DAILY., Disp: 180  capsule, Rfl: 0 .  diltiazem (CARDIZEM CD) 240 MG 24 hr capsule, TAKE ONE CAPSULE BY MOUTH EVERY DAY, Disp: 90 capsule, Rfl: 0 .  escitalopram (LEXAPRO) 20 MG tablet, Take 1 tablet (20 mg total) by mouth daily., Disp: 90 tablet, Rfl: 3 .  hydrochlorothiazide (HYDRODIURIL) 25 MG tablet, Take 1 tablet (25 mg total) by mouth daily., Disp: 90 tablet, Rfl: 2 .  KLOR-CON M20 20 MEQ tablet, Take 1 tablet (20 mEq total) by mouth daily., Disp: 90 tablet, Rfl: 1 .  KLOR-CON M20 20 MEQ tablet, TAKE 1 TABLET BY MOUTH EVERY DAY, Disp: 30 tablet, Rfl: 1 .  XARELTO 15 MG TABS tablet, Take 1 tablet (15 mg total) by mouth daily., Disp: 42 tablet, Rfl: 2   Allergies  Allergen Reactions  . Crestor [Rosuvastatin] Other (See Comments)    Leg pain    Review of Systems   Pertinent items are noted in the HPI. Otherwise, ROS is negative.  Vitals:   Vitals:   01/10/18 1411  BP: 124/82  Temp: 97.8 F (36.6 C)  TempSrc: Oral  Weight: 218 lb 6.4 oz (99.1 kg)  Height: 5\' 6"  (1.676 m)     Body mass index is 35.25 kg/m.  Physical Exam:   Physical Exam  Constitutional: She is oriented to person, place, and time. She appears well-developed and well-nourished. No distress.  HENT:  Head: Normocephalic and atraumatic.  Right Ear: External ear normal.  Left Ear: External ear normal.  Nose: Nose normal.  Mouth/Throat: Oropharynx is clear and moist.  Eyes: Pupils are equal, round, and reactive to light. Conjunctivae and EOM are normal.  Neck: Normal range of motion. Neck supple. No thyromegaly present.  Cardiovascular: Normal rate, regular rhythm, normal heart sounds and intact distal pulses.  Pulmonary/Chest: Effort normal and breath sounds normal.  Abdominal: Soft. Bowel sounds are normal.  Musculoskeletal: Normal range of motion.  Lymphadenopathy:    She has no cervical adenopathy.  Neurological: She is alert and oriented to person, place, and time.  Skin: Skin is warm and dry. Capillary refill takes  less than 2 seconds.  Psychiatric: She has a normal mood and affect. Her behavior is normal.  Nursing note and vitals reviewed.   Assessment and Plan:   Jamar was seen today for medication refill and leg pain.  Diagnoses and all orders for this visit:  Anxiety -     ALPRAZolam (XANAX) 1 MG tablet; Take 1 tablet (1 mg total) by mouth at bedtime. -     CBC with Differential/Platelet  Pure hyperglyceridemia -     Lipid panel -     LDL cholesterol, direct  Elevated serum creatinine -     Comprehensive metabolic panel  Screening for colon cancer -     Ambulatory referral to Gastroenterology  Need for prophylactic vaccination against Streptococcus pneumoniae (pneumococcus) -     Pneumococcal polysaccharide vaccine 23-valent greater than or equal to 2yo subcutaneous/IM; Future -     Pneumococcal polysaccharide vaccine 23-valent greater than or equal to 2yo subcutaneous/IM  Pain of lower extremity, unspecified laterality -     Ambulatory referral to Vascular Surgery    . Reviewed expectations re: course of current medical issues. . Discussed self-management of symptoms. . Outlined signs and symptoms indicating need for more acute intervention. . Patient verbalized understanding and all questions were answered. Marland Kitchen Health Maintenance issues including appropriate healthy diet, exercise, and smoking avoidance were discussed with patient. . See orders for this visit as documented in the electronic medical record. . Patient received an After Visit Summary.  Briscoe Deutscher, DO [place], [facility] 01/12/2018  No future appointments.  CMA served as Education administrator during this visit. History, Physical, and Plan performed by medical provider. The above documentation has been reviewed and is accurate and complete. Briscoe Deutscher, D.O.

## 2018-01-11 LAB — LIPID PANEL
Cholesterol: 246 mg/dL — ABNORMAL HIGH (ref 0–200)
HDL: 61.3 mg/dL (ref >39.00)
NonHDL: 184.89
Total CHOL/HDL Ratio: 4
Triglycerides: 245 mg/dL — ABNORMAL HIGH (ref 0.0–149.0)
VLDL: 49 mg/dL — ABNORMAL HIGH (ref 0.0–40.0)

## 2018-01-11 LAB — COMPREHENSIVE METABOLIC PANEL
ALT: 8 U/L (ref 0–35)
AST: 13 U/L (ref 0–37)
Albumin: 4 g/dL (ref 3.5–5.2)
Alkaline Phosphatase: 55 U/L (ref 39–117)
BUN: 18 mg/dL (ref 6–23)
CO2: 31 mEq/L (ref 19–32)
Calcium: 9.8 mg/dL (ref 8.4–10.5)
Chloride: 99 mEq/L (ref 96–112)
Creatinine, Ser: 1.15 mg/dL (ref 0.40–1.20)
GFR: 50.06 mL/min — ABNORMAL LOW (ref >60.00)
Glucose, Bld: 94 mg/dL (ref 70–99)
Potassium: 4.2 mEq/L (ref 3.5–5.1)
Sodium: 139 mEq/L (ref 135–145)
Total Bilirubin: 0.4 mg/dL (ref 0.2–1.2)
Total Protein: 7.1 g/dL (ref 6.0–8.3)

## 2018-01-11 LAB — CBC WITH DIFFERENTIAL/PLATELET
Basophils Absolute: 0.1 10*3/uL (ref 0.0–0.1)
Basophils Relative: 1 % (ref 0.0–3.0)
Eosinophils Absolute: 0.2 10*3/uL (ref 0.0–0.7)
Eosinophils Relative: 2.5 % (ref 0.0–5.0)
HCT: 38.5 % (ref 36.0–46.0)
Hemoglobin: 13.2 g/dL (ref 12.0–15.0)
Lymphocytes Relative: 39 % (ref 12.0–46.0)
Lymphs Abs: 2.5 10*3/uL (ref 0.7–4.0)
MCHC: 34.2 g/dL (ref 30.0–36.0)
MCV: 94.9 fl (ref 78.0–100.0)
Monocytes Absolute: 0.5 10*3/uL (ref 0.1–1.0)
Monocytes Relative: 7.9 % (ref 3.0–12.0)
Neutro Abs: 3.2 10*3/uL (ref 1.4–7.7)
Neutrophils Relative %: 49.6 % (ref 43.0–77.0)
Platelets: 362 10*3/uL (ref 150.0–400.0)
RBC: 4.06 Mil/uL (ref 3.87–5.11)
RDW: 12.3 % (ref 11.5–15.5)
WBC: 6.4 10*3/uL (ref 4.0–10.5)

## 2018-01-11 LAB — LDL CHOLESTEROL, DIRECT: Direct LDL: 147 mg/dL

## 2018-01-12 ENCOUNTER — Encounter: Payer: Self-pay | Admitting: Family Medicine

## 2018-01-12 ENCOUNTER — Encounter: Payer: Self-pay | Admitting: Gastroenterology

## 2018-01-16 ENCOUNTER — Other Ambulatory Visit: Payer: Self-pay

## 2018-01-31 ENCOUNTER — Other Ambulatory Visit: Payer: Self-pay

## 2018-01-31 DIAGNOSIS — M79606 Pain in leg, unspecified: Secondary | ICD-10-CM

## 2018-02-01 ENCOUNTER — Ambulatory Visit (INDEPENDENT_AMBULATORY_CARE_PROVIDER_SITE_OTHER): Payer: Medicare Other | Admitting: Gastroenterology

## 2018-02-01 ENCOUNTER — Encounter: Payer: Self-pay | Admitting: Gastroenterology

## 2018-02-01 ENCOUNTER — Telehealth: Payer: Self-pay | Admitting: Emergency Medicine

## 2018-02-01 VITALS — BP 86/62 | HR 65 | Ht 66.0 in | Wt 218.0 lb

## 2018-02-01 DIAGNOSIS — Z7901 Long term (current) use of anticoagulants: Secondary | ICD-10-CM | POA: Insufficient documentation

## 2018-02-01 DIAGNOSIS — Z1211 Encounter for screening for malignant neoplasm of colon: Secondary | ICD-10-CM

## 2018-02-01 DIAGNOSIS — Z800 Family history of malignant neoplasm of digestive organs: Secondary | ICD-10-CM | POA: Diagnosis not present

## 2018-02-01 MED ORDER — NA SULFATE-K SULFATE-MG SULF 17.5-3.13-1.6 GM/177ML PO SOLN: 1.0000 | ORAL | 0 refills | Status: DC

## 2018-02-01 NOTE — Progress Notes (Addendum)
02/01/2018 Jordan Ware [id number] 01-11-1951   HISTORY OF PRESENT ILLNESS:  This is a 67 year old female who is new to our practice.  She is here today to discuss colonoscopy.  She has never undergone colonoscopy in the past.  She actually has family history of colon cancer in her mother who was diagnosed in her 23's.  She is on Xarelto for atrial fibrillation and has been on that for several years.  She just moved here from [place] within the past year so does not have a cardiologist here, therefore, her PCP, Dr. Juleen China, is prescribing her medication for now.  No GI complaints including rectal bleeding or abdominal pain.  She says that she moves her bowels well.   Past Medical History:  Diagnosis Date  . Anxiety 06/05/2017  . Atrial fibrillation ([place])   . Depression   . Dysthymia 06/05/2017  . Gallstones    1998  . High cholesterol   . HLD (hyperlipidemia) 06/05/2017  . Hypertension    Past Surgical History:  Procedure Laterality Date  . APPENDECTOMY    . CHOLECYSTECTOMY     partial  . HERNIA REPAIR  2005   and 2010    reports that she has never smoked. She has never used smokeless tobacco. She reports that she does not drink alcohol or use drugs. family history includes Breast cancer (age of onset: 76) in her mother; Colon cancer (age of onset: 27) in her mother; Prostate cancer in her brother. Allergies  Allergen Reactions  . Crestor [Rosuvastatin] Other (See Comments)    Leg pain       Outpatient Encounter Medications as of 02/01/2018  Medication Sig  . ALPRAZolam (XANAX) 1 MG tablet Take 1 tablet (1 mg total) by mouth at bedtime.  Marland Kitchen BYSTOLIC 10 MG tablet TAKE 1 TABLET BY MOUTH EVERY DAY  . diltiazem (CARDIZEM CD) 120 MG 24 hr capsule TAKE 2 CAPSULES (240 MG TOTAL) BY MOUTH DAILY. (Patient taking differently: Take 120 mg by mouth daily. )  . escitalopram (LEXAPRO) 20 MG tablet Take 1 tablet (20 mg total) by mouth daily.  . Flaxseed, Linseed, (FLAXSEED OIL) 1200  MG CAPS Take 1 capsule by mouth daily.  . hydrochlorothiazide (HYDRODIURIL) 25 MG tablet Take 1 tablet (25 mg total) by mouth daily.  Marland Kitchen KLOR-CON M20 20 MEQ tablet TAKE 1 TABLET BY MOUTH EVERY DAY  . Multiple Vitamin (MULTI-VITAMIN DAILY PO) Take by mouth daily.  Alveda Reasons 15 MG TABS tablet Take 1 tablet (15 mg total) by mouth daily.   No facility-administered encounter medications on file as of 02/01/2018.      REVIEW OF SYSTEMS  : All other systems reviewed and negative except where noted in the History of Present Illness.   PHYSICAL EXAM: BP (!) 86/62   Pulse 65   Ht 5\' 6"  (1.676 m)   Wt 218 lb (98.9 kg)   BMI 35.19 kg/m  General: Well developed white female in no acute distress Head: Normocephalic and atraumatic Eyes:  Sclerae anicteric, conjunctiva pink. Ears: Normal auditory acuity Lungs: Clear throughout to auscultation; no increased WOB. Heart: Regular rate and rhythm; no M/R/G. Abdomen: Soft, non-distended.  BS present.  Non-tender. Rectal:  Will be done at the time of colonoscopy. Musculoskeletal: Symmetrical with no gross deformities  Skin: No lesions on visible extremities Extremities: No edema  Neurological: Alert oriented x 4, grossly non-focal Psychological:  Alert and cooperative. Normal mood and affect  ASSESSMENT AND PLAN: *Screening colonoscopy:  Does actually have family history of colon cancer in her mother who was diagnosed in her 57's.  Will schedule for colonoscopy with Dr. Hilarie Fredrickson. *Chronic anticoagulation with Xarelto for atrial fibrillation:  Will hold Xarelto for 2 days prior to endoscopic procedures - will instruct when and how to resume after procedure. Benefits and risks of procedure explained including risks of bleeding, perforation, infection, missed lesions, reactions to medications and possible need for hospitalization and surgery for complications. Additional rare but real risk of stroke or other vascular clotting events off Xarelto also explained  and need to seek urgent help if any signs of these problems occur. Will communicate by phone or EMR with patient's prescribing provider, Dr. Juleen China, to confirm that holding Xarelto is reasonable in this case.   **The risks, benefits, and alternatives were discussed with the patient and she consents to proceed.   CC:  Briscoe Deutscher, DO  Agree with Jordan Ware management.  Gatha Mayer, MD, [facility]

## 2018-02-01 NOTE — Telephone Encounter (Signed)
   Jordan Ware April 01, 1951 [id number]  Dear Dr. Juleen China:  We have scheduled the above named patient for a colonoscopy procedure. Our records show that she is on anticoagulation therapy.  Please advise as to whether the patient may come off their therapy of Xarelto 2 days prior to their procedure which is scheduled for 04-18-18.  Please route your response to Tinnie Gens, CMA or fax response to [phone].  Sincerely,    [facility]

## 2018-02-01 NOTE — Patient Instructions (Signed)

## 2018-02-02 NOTE — Telephone Encounter (Signed)
Left message on patients voicemail to call office.   

## 2018-02-02 NOTE — Telephone Encounter (Signed)
Okay to DC Xarelto 2 days prior to colonoscopy.

## 2018-02-06 NOTE — Telephone Encounter (Signed)
Spoke to patient and informed her to hold Xarelto 2 days before procedure. Patient verbalized understanding and will resume via discharge instructions.

## 2018-02-15 ENCOUNTER — Other Ambulatory Visit: Payer: Self-pay | Admitting: Family Medicine

## 2018-02-15 DIAGNOSIS — I4819 Other persistent atrial fibrillation: Secondary | ICD-10-CM

## 2018-02-21 ENCOUNTER — Ambulatory Visit (HOSPITAL_COMMUNITY)
Admission: RE | Admit: 2018-02-21 | Discharge: 2018-02-21 | Disposition: A | Discharge status: Home / Self Care | Payer: Medicare Other | Source: Ambulatory Visit | Attending: Vascular Surgery | Admitting: Vascular Surgery

## 2018-02-21 ENCOUNTER — Ambulatory Visit (INDEPENDENT_AMBULATORY_CARE_PROVIDER_SITE_OTHER): Payer: Medicare Other | Admitting: Vascular Surgery

## 2018-02-21 ENCOUNTER — Encounter: Payer: Self-pay | Admitting: Vascular Surgery

## 2018-02-21 DIAGNOSIS — R29898 Other symptoms and signs involving the musculoskeletal system: Secondary | ICD-10-CM | POA: Insufficient documentation

## 2018-02-21 DIAGNOSIS — I10 Essential (primary) hypertension: Secondary | ICD-10-CM | POA: Insufficient documentation

## 2018-02-21 DIAGNOSIS — M79606 Pain in leg, unspecified: Secondary | ICD-10-CM | POA: Insufficient documentation

## 2018-02-21 DIAGNOSIS — R0989 Other specified symptoms and signs involving the circulatory and respiratory systems: Secondary | ICD-10-CM | POA: Diagnosis not present

## 2018-02-21 NOTE — Progress Notes (Signed)
Requested by:  Briscoe Deutscher, [address], [place] [address]  Reason for consultation: bilateral leg weakness    History of Present Illness   Jordan Ware is a 67 y.o. (Sep 21, 1950) female who presents with cc: bilateral leg weakness.  Patient denies classic intermittent claudication or rest pain.  She notes some pain, vague in character, worse with stair climbing and walking, relieved with rest.  Patient denies any wounds or gangrene..  Past Medical History:  Diagnosis Date  . Anxiety 06/05/2017  . Atrial fibrillation ([place])   . Depression   . Dysthymia 06/05/2017  . Gallstones    1998  . High cholesterol   . HLD (hyperlipidemia) 06/05/2017  . Hypertension     Past Surgical History:  Procedure Laterality Date  . APPENDECTOMY    . CHOLECYSTECTOMY     partial  . HERNIA REPAIR  2005   and 2010     Social History   Socioeconomic History  . Marital status: Widowed    Spouse name: Not on file  . Number of children: Not on file  . Years of education: Not on file  . Highest education level: Not on file  Occupational History  . Not on file  Social Needs  . Financial resource strain: Not on file  . Food insecurity:    Worry: Not on file    Inability: Not on file  . Transportation needs:    Medical: Not on file    Non-medical: Not on file  Tobacco Use  . Smoking status: Never Smoker  . Smokeless tobacco: Never Used  Substance and Sexual Activity  . Alcohol use: No  . Drug use: No  . Sexual activity: Yes    Partners: Male  Lifestyle  . Physical activity:    Days per week: Not on file    Minutes per session: Not on file  . Stress: Not on file  Relationships  . Social connections:    Talks on phone: Not on file    Gets together: Not on file    Attends religious service: Not on file    Active member of club or organization: Not on file    Attends meetings of clubs or organizations: Not on file    Relationship status: Not on file  .  Intimate partner violence:    Fear of current or ex partner: Not on file    Emotionally abused: Not on file    Physically abused: Not on file    Forced sexual activity: Not on file  Other Topics Concern  . Not on file  Social History Narrative  . Not on file    Family History  Problem Relation Age of Onset  . Breast cancer Mother 33  . Colon cancer Mother 93  . Prostate cancer Brother     Current Outpatient Medications  Medication Sig Dispense Refill  . ALPRAZolam (XANAX) 1 MG tablet Take 1 tablet (1 mg total) by mouth at bedtime. 90 tablet 0  . BYSTOLIC 10 MG tablet TAKE 1 TABLET BY MOUTH EVERY DAY 90 tablet 1  . diltiazem (CARDIZEM CD) 120 MG 24 hr capsule TAKE 2 CAPSULES (240 MG TOTAL) BY MOUTH DAILY. (Patient taking differently: Take 120 mg by mouth daily. ) 180 capsule 0  . escitalopram (LEXAPRO) 20 MG tablet Take 1 tablet (20 mg total) by mouth daily. 90 tablet 3  . Flaxseed, Linseed, (FLAXSEED OIL) 1200 MG CAPS Take 1 capsule by mouth daily.    Marland Kitchen  hydrochlorothiazide (HYDRODIURIL) 25 MG tablet Take 1 tablet (25 mg total) by mouth daily. 90 tablet 2  . KLOR-CON M20 20 MEQ tablet TAKE 1 TABLET BY MOUTH EVERY DAY 30 tablet 1  . Multiple Vitamin (MULTI-VITAMIN DAILY PO) Take by mouth daily.    . Na Sulfate-K Sulfate-Mg Sulf (SUPREP BOWEL PREP KIT) 17.5-3.13-1.6 GM/177ML SOLN Take 1 kit by mouth as directed. 324 mL 0  . XARELTO 15 MG TABS tablet TAKE 1 TABLET BY MOUTH EVERY DAY 42 tablet 2   No current facility-administered medications for this visit.     Allergies  Allergen Reactions  . Crestor [Rosuvastatin] Other (See Comments)    Leg pain     REVIEW OF SYSTEMS (negative unless checked):   Cardiac:  '[]'  Chest pain or chest pressure? '[]'  Shortness of breath upon activity? '[]'  Shortness of breath when lying flat? '[]'  Irregular heart rhythm?  Vascular:  '[x]'  Pain in calf, thigh, or hip brought on by walking? '[]'  Pain in feet at night that wakes you up from your sleep? '[]'   Blood clot in your veins? '[]'  Leg swelling?  Pulmonary:  '[]'  Oxygen at home? '[]'  Productive cough? '[]'  Wheezing?  Neurologic:  '[]'  Sudden weakness in arms or legs? '[]'  Sudden numbness in arms or legs? '[]'  Sudden onset of difficult speaking or slurred speech? '[]'  Temporary loss of vision in one eye? '[]'  Problems with dizziness?  Gastrointestinal:  '[]'  Blood in stool? '[]'  Vomited blood?  Genitourinary:  '[]'  Burning when urinating? '[]'  Blood in urine?  Psychiatric:  '[]'  Major depression  Hematologic:  '[]'  Bleeding problems? '[]'  Problems with blood clotting?  Dermatologic:  '[]'  Rashes or ulcers?  Constitutional:  '[]'  Fever or chills?  Ear/Nose/Throat:  '[]'  Change in hearing? '[]'  Nose bleeds? '[]'  Sore throat?  Musculoskeletal:  '[]'  Back pain? '[]'  Joint pain? '[]'  Muscle pain?   For VQI Use Only   PRE-ADM LIVING Home  AMB STATUS Ambulatory  CAD Sx None  PRIOR CHF None  STRESS TEST No    Physical Examination      Vitals:   02/21/18 1226  BP: 110/82  Pulse: 71  Temp: (!) 97 F (36.1 C)  TempSrc: Oral  SpO2: 97%  Weight: 221 lb 9.6 oz (100.5 kg)  Height: '5\' 6"'  (1.676 m)   Body mass index is 35.77 kg/m.  General Alert, O x 3, WD, NAD  Head [place]/AT,    Ear/Nose/ Throat Hearing grossly intact, nares without erythema or drainage, oropharynx without Erythema or Exudate, Mallampati score: 3,   Eyes PERRLA, EOMI,    Neck Supple, mid-line trachea,    Pulmonary Sym exp, good B air movt, CTA B  Cardiac RRR, Nl S1, S2, no Murmurs, No rubs, No S3,S4  Vascular Vessel Right Left  Radial Palpable Palpable  Brachial Palpable Palpable  Carotid Palpable, No Bruit Palpable, No Bruit  Aorta Not palpable N/A  Femoral Palpable Palpable  Popliteal Not palpable Not palpable  PT Palpable Palpable  DP Palpable Palpable    Gastro- intestinal soft, non-distended, non-tender to palpation, No guarding or rebound, no HSM, no masses, no CVAT B, No palpable prominent aortic pulse,     Musculo- skeletal M/S 5/5 throughout  , Extremities without ischemic changes  , No edema present, No visible varicosities , No Lipodermatosclerosis present  Neurologic Cranial nerves 2-12 intact , Pain and light touch intact in extremities , Motor exam as listed above  Psychiatric Judgement intact, Mood & affect appropriate for pt's clinical situation  Dermatologic See  M/S exam for extremity exam, No rashes otherwise noted  Lymphatic  Palpable lymph nodes: None    Non-invasive Vascular Imaging     ABI (02/21/2018)  R:   ABI: 1.19,   PT: tri  DP: tri  TBI:  0.80  L:   ABI: 1.19,   PT: tri  DP: tri   TBI: 0.88   Outside Studies/Documentation   3 pages of outside documents were reviewed including: outpatient PCP charts.   Medical Decision Making   Jordan Ware is a 67 y.o. female who presents with: no evidence of PAD, bilateral leg weakness   Pt's sx are not consistent with PAD.  BLE ABI demonstrate no PAD.  Pt has obvious difficulty climbing onto exam table, suggesting either spinal etiology for her claudication vs a mechanical etiology.  Evaluation by Orthopedics might be fruitful.  Thank you for allowing [place] to participate in this patient's care.   Adele Barthel, MD, FACS [facility] Office: [phone] Pager: [phone]  02/21/2018, 12:59 PM

## 2018-03-09 ENCOUNTER — Other Ambulatory Visit: Payer: Self-pay | Admitting: Family Medicine

## 2018-03-09 DIAGNOSIS — I4819 Other persistent atrial fibrillation: Secondary | ICD-10-CM

## 2018-03-10 ENCOUNTER — Other Ambulatory Visit: Payer: Self-pay | Admitting: Family Medicine

## 2018-03-10 DIAGNOSIS — I4819 Other persistent atrial fibrillation: Secondary | ICD-10-CM

## 2018-03-16 ENCOUNTER — Other Ambulatory Visit: Payer: Self-pay | Admitting: Family Medicine

## 2018-03-16 DIAGNOSIS — I10 Essential (primary) hypertension: Secondary | ICD-10-CM

## 2018-04-18 ENCOUNTER — Ambulatory Visit (AMBULATORY_SURGERY_CENTER): Payer: Medicare Other | Admitting: Internal Medicine

## 2018-04-18 ENCOUNTER — Encounter: Payer: Self-pay | Admitting: Internal Medicine

## 2018-04-18 VITALS — BP 118/66 | HR 66 | Temp 96.8°F | Resp 12 | Ht 66.0 in | Wt 221.0 lb

## 2018-04-18 DIAGNOSIS — D122 Benign neoplasm of ascending colon: Secondary | ICD-10-CM

## 2018-04-18 DIAGNOSIS — Z1211 Encounter for screening for malignant neoplasm of colon: Secondary | ICD-10-CM

## 2018-04-18 DIAGNOSIS — K635 Polyp of colon: Secondary | ICD-10-CM

## 2018-04-18 DIAGNOSIS — D123 Benign neoplasm of transverse colon: Secondary | ICD-10-CM | POA: Diagnosis not present

## 2018-04-18 DIAGNOSIS — D125 Benign neoplasm of sigmoid colon: Secondary | ICD-10-CM

## 2018-04-18 DIAGNOSIS — D124 Benign neoplasm of descending colon: Secondary | ICD-10-CM

## 2018-04-18 DIAGNOSIS — D126 Benign neoplasm of colon, unspecified: Secondary | ICD-10-CM

## 2018-04-18 DIAGNOSIS — F419 Anxiety disorder, unspecified: Secondary | ICD-10-CM | POA: Diagnosis not present

## 2018-04-18 DIAGNOSIS — Z800 Family history of malignant neoplasm of digestive organs: Secondary | ICD-10-CM | POA: Diagnosis not present

## 2018-04-18 MED ORDER — SODIUM CHLORIDE 0.9 % IV SOLN
500.0000 mL | Freq: Once | INTRAVENOUS | Status: DC
Start: 2018-04-18 — End: 2018-04-18

## 2018-04-18 NOTE — Op Note (Signed)
[facility] Patient Name: Jordan Ware Procedure Date: 04/18/2018 11:50 AM MRN: [id number] Endoscopist: Jerene Bears , MD Age: 67 Referring MD:  Date of Birth: [birth year] Gender: Female Account #: [id number] Procedure:                Colonoscopy Indications:              Screening patient at increased risk: Family history                            of 1st-degree relative with colorectal cancer at                            age 25 years (or older), This is the patient's                            first colonoscopy Medicines:                Monitored Anesthesia Care Procedure:                Pre-Anesthesia Assessment:                           - Prior to the procedure, a History and Physical                            was performed, and patient medications and                            allergies were reviewed. The patient's tolerance of                            previous anesthesia was also reviewed. The risks                            and benefits of the procedure and the sedation                            options and risks were discussed with the patient.                            All questions were answered, and informed consent                            was obtained. Prior Anticoagulants: The patient has                            taken Xarelto (rivaroxaban), last dose was 2 days                            prior to procedure. ASA Grade Assessment: III - A                            patient with severe systemic disease. After  reviewing the risks and benefits, the patient was                            deemed in satisfactory condition to undergo the                            procedure.                           After obtaining informed consent, the colonoscope                            was passed under direct vision. Throughout the                            procedure, the patient's blood pressure, pulse, and   oxygen saturations were monitored continuously. The                            Colonoscope was introduced through the anus and                            advanced to the cecum, identified by appendiceal                            orifice and ileocecal valve. The colonoscopy was                            performed without difficulty. The patient tolerated                            the procedure well. The quality of the bowel                            preparation was adequate with Suprep after copious                            irrigation and lavage. The ileocecal valve,                            appendiceal orifice, and rectum were photographed. Scope In: 12:01:38 PM Scope Out: 12:30:39 PM Scope Withdrawal Time: 0 hours 25 minutes 13 seconds  Total Procedure Duration: 0 hours 29 minutes 1 second  Findings:                 The digital rectal exam was normal.                           Three sessile polyps were found in the ascending                            colon. The polyps were 3 to 6 mm in size. These                            polyps were  removed with a cold snare. Resection                            was complete, but the polyp tissue was only                            partially retrieved (2 of 3 retrieved).                           Three sessile polyps were found in the transverse                            colon. The polyps were 2 to 5 mm in size. These                            polyps were removed with a cold snare. Resection                            and retrieval were complete.                           Two sessile polyps were found in the descending                            colon. The polyps were 4 to 5 mm in size. These                            polyps were removed with a cold snare. Resection                            and retrieval were complete.                           A 4 mm polyp was found in the sigmoid colon. The                            polyp was sessile.  The polyp was removed with a                            cold snare. Resection and retrieval were complete.                           Multiple small and large-mouthed diverticula were                            found in the sigmoid colon and descending colon.                           Internal hemorrhoids were found during                            retroflexion. The hemorrhoids were small. Complications:  No immediate complications. Estimated Blood Loss:     Estimated blood loss was minimal. Impression:               - Three 3 to 6 mm polyps in the ascending colon,                            removed with a cold snare. Complete resection.                            Partial retrieval.                           - Three 2 to 5 mm polyps in the transverse colon,                            removed with a cold snare. Resected and retrieved.                           - Two 4 to 5 mm polyps in the descending colon,                            removed with a cold snare. Resected and retrieved.                           - One 4 mm polyp in the sigmoid colon, removed with                            a cold snare. Resected and retrieved.                           - Moderate diverticulosis in the sigmoid colon and                            in the descending colon.                           - Internal hemorrhoids. Recommendation:           - Patient has a contact number available for                            emergencies. The signs and symptoms of potential                            delayed complications were discussed with the                            patient. Return to normal activities tomorrow.                            Written discharge instructions were provided to the                            patient.                           -  Resume previous diet.                           - Continue present medications.                           - Await pathology results.                            - Repeat colonoscopy is recommended for                            surveillance. The colonoscopy date will be                            determined after pathology results from today's                            exam become available for review. Jerene Bears, MD 04/18/2018 12:39:40 PM This report has been signed electronically.

## 2018-04-18 NOTE — Progress Notes (Signed)
Called to room to assist during endoscopic procedure.  Patient ID and intended procedure confirmed with present staff. Received instructions for my participation in the procedure from the performing physician.  

## 2018-04-18 NOTE — Patient Instructions (Signed)
   Ok to resume Xarelto tomorrow (04/19/18)  INFORMATION ON POLYPS,DIVERTICULOSIS,& HEMORRHOIDS GIVEN TO YOU TODAY  AWAIT PATHOLOGY RESULTS ON POLYPS REMOVED    YOU HAD AN ENDOSCOPIC PROCEDURE TODAY AT [facility]:   Refer to the procedure report that was given to you for any specific questions about what was found during the examination.  If the procedure report does not answer your questions, please call your gastroenterologist to clarify.  If you requested that your care partner not be given the details of your procedure findings, then the procedure report has been included in a sealed envelope for you to review at your convenience later.  YOU SHOULD EXPECT: Some feelings of bloating in the abdomen. Passage of more gas than usual.  Walking can help get rid of the air that was put into your GI tract during the procedure and reduce the bloating. If you had a lower endoscopy (such as a colonoscopy or flexible sigmoidoscopy) you may notice spotting of blood in your stool or on the toilet paper. If you underwent a bowel prep for your procedure, you may not have a normal bowel movement for a few days.  Please Note:  You might notice some irritation and congestion in your nose or some drainage.  This is from the oxygen used during your procedure.  There is no need for concern and it should clear up in a day or so.  SYMPTOMS TO REPORT IMMEDIATELY:   Following lower endoscopy (colonoscopy or flexible sigmoidoscopy):  Excessive amounts of blood in the stool  Significant tenderness or worsening of abdominal pains  Swelling of the abdomen that is new, acute  Fever of 100F or higher    For urgent or emergent issues, a gastroenterologist can be reached at any hour by calling [phone].   DIET:  We do recommend a small meal at first, but then you may proceed to your regular diet.  Drink plenty of fluids but you should avoid alcoholic beverages for 24 hours.  ACTIVITY:  You  should plan to take it easy for the rest of today and you should NOT DRIVE or use heavy machinery until tomorrow (because of the sedation medicines used during the test).    FOLLOW UP: Our staff will call the number listed on your records the next business day following your procedure to check on you and address any questions or concerns that you may have regarding the information given to you following your procedure. If we do not reach you, we will leave a message.  However, if you are feeling well and you are not experiencing any problems, there is no need to return our call.  We will assume that you have returned to your regular daily activities without incident.  If any biopsies were taken you will be contacted by phone or by letter within the next 1-3 weeks.  Please call us at [phone] if you have not heard about the biopsies in 3 weeks.    SIGNATURES/CONFIDENTIALITY: You and/or your care partner have signed paperwork which will be entered into your electronic medical record.  These signatures attest to the fact that that the information above on your After Visit Summary has been reviewed and is understood.  Full responsibility of the confidentiality of this discharge information lies with you and/or your care-partner.

## 2018-04-18 NOTE — Progress Notes (Signed)
To PACU, vss patent aw report to rn 

## 2018-04-19 ENCOUNTER — Telehealth: Payer: Self-pay | Admitting: *Deleted

## 2018-04-19 NOTE — Telephone Encounter (Signed)
  Follow up Call-  Call back number 04/18/2018  Post procedure Call Back phone  # [phone]  Permission to leave phone message Yes     Patient questions:  Do you have a fever, pain , or abdominal swelling? No. Pain Score  0 *  Have you tolerated food without any problems? Yes.    Have you been able to return to your normal activities? Yes.    Do you have any questions about your discharge instructions: Diet   No. Medications  No. Follow up visit  No.  Do you have questions or concerns about your Care? No.  Actions: * If pain score is 4 or above: No action needed, pain <4.

## 2018-04-23 ENCOUNTER — Encounter: Payer: Self-pay | Admitting: Internal Medicine

## 2018-04-26 DIAGNOSIS — H25013 Cortical age-related cataract, bilateral: Secondary | ICD-10-CM | POA: Diagnosis not present

## 2018-04-26 DIAGNOSIS — H524 Presbyopia: Secondary | ICD-10-CM | POA: Diagnosis not present

## 2018-05-01 ENCOUNTER — Other Ambulatory Visit: Payer: Self-pay | Admitting: Family Medicine

## 2018-05-01 DIAGNOSIS — F419 Anxiety disorder, unspecified: Secondary | ICD-10-CM

## 2018-05-01 NOTE — Telephone Encounter (Signed)
Please advise on refill.

## 2018-05-03 DIAGNOSIS — Z23 Encounter for immunization: Secondary | ICD-10-CM | POA: Diagnosis not present

## 2018-06-06 ENCOUNTER — Other Ambulatory Visit: Payer: Self-pay | Admitting: Family Medicine

## 2018-06-06 DIAGNOSIS — I4819 Other persistent atrial fibrillation: Secondary | ICD-10-CM

## 2018-06-25 ENCOUNTER — Other Ambulatory Visit: Payer: Self-pay | Admitting: Family Medicine

## 2018-06-25 DIAGNOSIS — I10 Essential (primary) hypertension: Secondary | ICD-10-CM

## 2018-08-13 ENCOUNTER — Other Ambulatory Visit: Payer: Self-pay | Admitting: Family Medicine

## 2018-08-13 DIAGNOSIS — I4819 Other persistent atrial fibrillation: Secondary | ICD-10-CM

## 2018-09-06 ENCOUNTER — Telehealth: Payer: Self-pay | Admitting: Family Medicine

## 2018-09-06 DIAGNOSIS — F419 Anxiety disorder, unspecified: Secondary | ICD-10-CM

## 2018-09-06 NOTE — Telephone Encounter (Signed)
Last script 05/03/18 #90 no rf Last app 01/10/18 NO F/U

## 2018-09-07 ENCOUNTER — Other Ambulatory Visit: Payer: Self-pay | Admitting: Family Medicine

## 2018-09-07 DIAGNOSIS — F419 Anxiety disorder, unspecified: Secondary | ICD-10-CM

## 2018-09-07 MED ORDER — ALPRAZOLAM 1 MG PO TABS: 1.0000 mg | ORAL_TABLET | Freq: Every day | ORAL | 0 refills | Status: DC

## 2018-09-07 NOTE — Telephone Encounter (Signed)
Copied from [place] [id number]. Topic: Quick Communication - Rx Refill/Question >> Sep 07, 2018 11:07 AM Alfredia Ferguson R wrote: Medication: ALPRAZolam Duanne Moron) 1 MG tablet  Has the patient contacted their pharmacy? Yes, no refills left, Pt scheduled appt   Preferred Pharmacy (with phone number or street name): [id number] - [place], [place] [phone] (Phone) [phone] (Fax)    Agent: Please be advised that RX refills may take up to 3 business days. We ask that you follow-up with your pharmacy.

## 2018-09-07 NOTE — Telephone Encounter (Signed)
Ok to fill 

## 2018-09-07 NOTE — Telephone Encounter (Signed)
Patient scheduled appt 1st avail 09/19/2018

## 2018-09-10 NOTE — Telephone Encounter (Signed)
See note

## 2018-09-16 ENCOUNTER — Other Ambulatory Visit: Payer: Self-pay | Admitting: Family Medicine

## 2018-09-16 DIAGNOSIS — I10 Essential (primary) hypertension: Secondary | ICD-10-CM

## 2018-09-19 ENCOUNTER — Ambulatory Visit: Payer: Medicare Other | Admitting: Family Medicine

## 2018-10-03 ENCOUNTER — Ambulatory Visit (INDEPENDENT_AMBULATORY_CARE_PROVIDER_SITE_OTHER): Payer: Medicare Other | Admitting: Family Medicine

## 2018-10-03 ENCOUNTER — Ambulatory Visit: Payer: Medicare Other | Admitting: Family Medicine

## 2018-10-03 ENCOUNTER — Other Ambulatory Visit (HOSPITAL_COMMUNITY)
Admission: RE | Admit: 2018-10-03 | Discharge: 2018-10-03 | Disposition: A | Discharge status: Home / Self Care | Payer: Medicare Other | Source: Ambulatory Visit | Attending: Family Medicine | Admitting: Family Medicine

## 2018-10-03 ENCOUNTER — Encounter: Payer: Self-pay | Admitting: Family Medicine

## 2018-10-03 VITALS — BP 110/72 | HR 84 | Temp 98.5°F | Ht 66.0 in | Wt 235.0 lb

## 2018-10-03 DIAGNOSIS — Z7901 Long term (current) use of anticoagulants: Secondary | ICD-10-CM

## 2018-10-03 DIAGNOSIS — F419 Anxiety disorder, unspecified: Secondary | ICD-10-CM

## 2018-10-03 DIAGNOSIS — E782 Mixed hyperlipidemia: Secondary | ICD-10-CM | POA: Diagnosis not present

## 2018-10-03 DIAGNOSIS — C4360 Malignant melanoma of unspecified upper limb, including shoulder: Secondary | ICD-10-CM | POA: Diagnosis not present

## 2018-10-03 DIAGNOSIS — R7989 Other specified abnormal findings of blood chemistry: Secondary | ICD-10-CM

## 2018-10-03 DIAGNOSIS — I10 Essential (primary) hypertension: Secondary | ICD-10-CM

## 2018-10-03 DIAGNOSIS — C4362 Malignant melanoma of left upper limb, including shoulder: Secondary | ICD-10-CM | POA: Diagnosis not present

## 2018-10-03 DIAGNOSIS — I4819 Other persistent atrial fibrillation: Secondary | ICD-10-CM | POA: Diagnosis not present

## 2018-10-03 DIAGNOSIS — L989 Disorder of the skin and subcutaneous tissue, unspecified: Secondary | ICD-10-CM | POA: Diagnosis not present

## 2018-10-03 LAB — COMPREHENSIVE METABOLIC PANEL
ALT: 9 U/L (ref 0–35)
AST: 11 U/L (ref 0–37)
Albumin: 4.1 g/dL (ref 3.5–5.2)
Alkaline Phosphatase: 67 U/L (ref 39–117)
BUN: 24 mg/dL — ABNORMAL HIGH (ref 6–23)
CO2: 32 mEq/L (ref 19–32)
Calcium: 9.6 mg/dL (ref 8.4–10.5)
Chloride: 99 mEq/L (ref 96–112)
Creatinine, Ser: 1.41 mg/dL — ABNORMAL HIGH (ref 0.40–1.20)
GFR: 37.15 mL/min — ABNORMAL LOW (ref >60.00)
Glucose, Bld: 86 mg/dL (ref 70–99)
Potassium: 3.6 mEq/L (ref 3.5–5.1)
Sodium: 141 mEq/L (ref 135–145)
Total Bilirubin: 0.4 mg/dL (ref 0.2–1.2)
Total Protein: 7.1 g/dL (ref 6.0–8.3)

## 2018-10-03 LAB — CBC WITH DIFFERENTIAL/PLATELET
Basophils Absolute: 0 10*3/uL (ref 0.0–0.1)
Basophils Relative: 0.5 % (ref 0.0–3.0)
Eosinophils Absolute: 0.1 10*3/uL (ref 0.0–0.7)
Eosinophils Relative: 1.8 % (ref 0.0–5.0)
HCT: 38.6 % (ref 36.0–46.0)
Hemoglobin: 13 g/dL (ref 12.0–15.0)
Lymphocytes Relative: 33.6 % (ref 12.0–46.0)
Lymphs Abs: 2.5 10*3/uL (ref 0.7–4.0)
MCHC: 33.7 g/dL (ref 30.0–36.0)
MCV: 94.9 fl (ref 78.0–100.0)
Monocytes Absolute: 0.5 10*3/uL (ref 0.1–1.0)
Monocytes Relative: 6.6 % (ref 3.0–12.0)
Neutro Abs: 4.3 10*3/uL (ref 1.4–7.7)
Neutrophils Relative %: 57.5 % (ref 43.0–77.0)
Platelets: 348 10*3/uL (ref 150.0–400.0)
RBC: 4.07 Mil/uL (ref 3.87–5.11)
RDW: 12.8 % (ref 11.5–15.5)
WBC: 7.4 10*3/uL (ref 4.0–10.5)

## 2018-10-03 LAB — LIPID PANEL
Cholesterol: 241 mg/dL — ABNORMAL HIGH (ref 0–200)
HDL: 51.4 mg/dL (ref >39.00)
NonHDL: 189.7
Total CHOL/HDL Ratio: 5
Triglycerides: 215 mg/dL — ABNORMAL HIGH (ref 0.0–149.0)
VLDL: 43 mg/dL — ABNORMAL HIGH (ref 0.0–40.0)

## 2018-10-03 LAB — LDL CHOLESTEROL, DIRECT: Direct LDL: 134 mg/dL

## 2018-10-03 NOTE — Progress Notes (Signed)
Jordan Ware is a 68 y.o. female is here for follow up.  History of Present Illness:   Lonell Grandchild, CMA acting as scribe for Dr. Briscoe Deutscher.   HPI:   1. Anxiety Current symptoms: insomnia. No current suicidal and homicidal ideation. Side effects from treatment: none. She is currently taking the Lexapro 20mg  daily and Xanax 1mg  at bedtime to help with sleep. She is still having trouble with sleeping.    2. Pure hyperglyceridemia  Is the patient taking medications without problems? No. Does the patient complain of muscle aches?  No. Trying to exercise on a regular basis? No. Compliant with diet? No.  Lab Results  Component Value Date   CHOL 241 (H) 10/03/2018   HDL 51.40 10/03/2018   LDLDIRECT 134.0 10/03/2018   TRIG 215.0 (H) 10/03/2018   CHOLHDL 5 10/03/2018   Lab Results  Component Value Date   ALT 9 10/03/2018   AST 11 10/03/2018   ALKPHOS 67 10/03/2018   BILITOT 0.4 10/03/2018         3. Essential hypertension  Review: taking medications as instructed, no medication side effects noted, no TIAs, no chest pain on exertion, no dyspnea on exertion, no swelling of ankles. Smoker: No. BP Readings from Last 3 Encounters:  10/03/18 110/72  04/18/18 118/66  02/21/18 110/82   Lab Results  Component Value Date   CREATININE 1.41 (H) 10/03/2018   CREATININE 1.15 01/10/2018   CREATININE 1.21 (H) 06/05/2017       Health Maintenance Due  Topic Date Due  . Hepatitis C Screening  [birth year]   Depression screen [facility] 2/9 10/03/2018 06/05/2017  Decreased Interest 0 0  Down, Depressed, Hopeless 0 0  PHQ - 2 Score 0 0  Altered sleeping 3 -  Tired, decreased energy 1 -  Change in appetite 0 -  Feeling bad or failure about yourself  0 -  Trouble concentrating 0 -  Moving slowly or fidgety/restless 0 -  Suicidal thoughts 0 -  PHQ-9 Score 4 -  Difficult doing work/chores Not difficult at all -   PMHx, SurgHx, SocialHx, FamHx, Medications, and Allergies were reviewed  in the Visit Navigator and updated as appropriate.   Patient Active Problem List   Diagnosis Date Noted  . Morbid obesity ([place]), BMI > 35 with comorbid conditions 10/07/2018  . Essential hypertension 10/07/2018  . Leg weakness, bilateral 02/21/2018  . Special screening for malignant neoplasms, colon 02/01/2018  . Chronic anticoagulation 02/01/2018  . Family history of colon cancer in mother 02/01/2018  . Elevated serum creatinine 06/11/2017  . Abnormal MRI of abdomen 06/11/2017  . Mixed hyperlipidemia 06/05/2017  . Dysthymia 06/05/2017  . Anxiety 06/05/2017  . Right upper quadrant pain 06/05/2017   Social History   Tobacco Use  . Smoking status: Never Smoker  . Smokeless tobacco: Never Used  Substance Use Topics  . Alcohol use: No  . Drug use: No   Current Medications and Allergies   .  ALPRAZolam (XANAX) 1 MG tablet, Take 1 tablet (1 mg total) by mouth at bedtime., Disp: 90 tablet, Rfl: 0 .  BYSTOLIC 10 MG tablet, TAKE 1 TABLET BY MOUTH EVERY DAY, Disp: 90 tablet, Rfl: 0 .  diltiazem (CARDIZEM CD) 120 MG 24 hr capsule, TAKE 2 CAPSULES (240 MG TOTAL) BY MOUTH DAILY., Disp: 180 capsule, Rfl: 0 .  escitalopram (LEXAPRO) 20 MG tablet, Take 1 tablet (20 mg total) by mouth daily., Disp: 90 tablet, Rfl: 3 .  Flaxseed, Linseed, (FLAXSEED OIL)  1200 MG CAPS, Take 1 capsule by mouth daily., Disp: , Rfl:  .  hydrochlorothiazide (HYDRODIURIL) 25 MG tablet, TAKE 1 TABLET BY MOUTH EVERY DAY, Disp: 90 tablet, Rfl: 2 .  KLOR-CON M20 20 MEQ tablet, TAKE 1 TABLET BY MOUTH EVERY DAY, Disp: 90 tablet, Rfl: 1 .  Multiple Vitamin (MULTI-VITAMIN DAILY PO), Take by mouth daily., Disp: , Rfl:  .  XARELTO 15 MG TABS tablet, TAKE 1 TABLET BY MOUTH EVERY DAY, Disp: 30 tablet, Rfl: 4   Allergies  Allergen Reactions  . Crestor [Rosuvastatin] Other (See Comments)    Leg pain    Review of Systems   Pertinent items are noted in the HPI. Otherwise, a complete ROS is negative.  Vitals   Vitals:    10/03/18 1403  BP: 110/72  Pulse: 84  Temp: 98.5 F (36.9 C)  TempSrc: Oral  SpO2: 97%  Weight: 235 lb (106.6 kg)  Height: 5\' 6"  (1.676 m)     Body mass index is 37.93 kg/m.  Physical Exam   Physical Exam Vitals signs and nursing note reviewed.  Constitutional:      General: She is not in acute distress.    Appearance: She is well-developed.  HENT:     Head: Normocephalic and atraumatic.     Right Ear: External ear normal.     Left Ear: External ear normal.     Nose: Nose normal.  Eyes:     Conjunctiva/sclera: Conjunctivae normal.     Pupils: Pupils are equal, round, and reactive to light.  Neck:     Musculoskeletal: Normal range of motion and neck supple.     Thyroid: No thyromegaly.  Cardiovascular:     Rate and Rhythm: Normal rate and regular rhythm.     Heart sounds: Normal heart sounds.  Pulmonary:     Effort: Pulmonary effort is normal.     Breath sounds: Normal breath sounds.  Abdominal:     General: Bowel sounds are normal.     Palpations: Abdomen is soft.  Musculoskeletal: Normal range of motion.  Lymphadenopathy:     Cervical: No cervical adenopathy.  Skin:    General: Skin is warm and dry.     Capillary Refill: Capillary refill takes less than 2 seconds.  Neurological:     Mental Status: She is alert and oriented to person, place, and time.  Psychiatric:        Behavior: Behavior normal.    Assessment and Plan   Jordan Ware was seen today for medication refill.  Diagnoses and all orders for this visit:  Anxiety Comments: Patient doing well on current regimen.  Will refill medications today.  Mixed hyperlipidemia Comments: Due for recheck.  Not currently on statin due to allergy.   Orders: -     Comprehensive metabolic panel -     Lipid panel -     LDL cholesterol, direct  Persistent atrial fibrillation  Essential hypertension Comments: At goal.  Will continue current medications.  Skin lesion Comments: Patient wants referral to  dermatology.  Will complete. Orders: -     CBC with Differential/Platelet -     Dermatology pathology([place])  Morbid obesity ([place]), BMI > 35 with comorbid conditions Comments: Discussed healthy diet choices and exercise as tolerated.  Chronic anticoagulation Comments: No concerns today.  Elevated serum creatinine Comments: History of elevated creatinine associated with mild dehydration.  Recheck today.   . Orders and follow up as documented in [facility], reviewed diet, exercise and weight control,  cardiovascular risk and specific lipid/LDL goals reviewed, reviewed medications and side effects in detail.  . Reviewed expectations re: course of current medical issues. . Outlined signs and symptoms indicating need for more acute intervention. . Patient verbalized understanding and all questions were answered. . Patient received an After Visit Summary.  CMA served as Education administrator during this visit. History, Physical, and Plan performed by medical provider. The above documentation has been reviewed and is accurate and complete. Briscoe Deutscher, D.O.  Briscoe Deutscher, DO [place], [facility] 10/07/2018

## 2018-10-03 NOTE — Progress Notes (Deleted)
Jordan Ware is a 68 y.o. female is here for follow up.  History of Present Illness:   (SCRIBE ATTESTATION)  HPI:   Health Maintenance Due  Topic Date Due  . Hepatitis C Screening  [birth year]  . TETANUS/TDAP  04/13/1970  . INFLUENZA VACCINE  03/29/2018   Depression screen PHQ 2/9 06/05/2017  Decreased Interest 0  Down, Depressed, Hopeless 0  PHQ - 2 Score 0   PMHx, SurgHx, SocialHx, FamHx, Medications, and Allergies were reviewed in the Visit Navigator and updated as appropriate.   Patient Active Problem List   Diagnosis Date Noted  . Leg weakness, bilateral 02/21/2018  . Special screening for malignant neoplasms, colon 02/01/2018  . Chronic anticoagulation 02/01/2018  . Family history of colon cancer in mother 02/01/2018  . Elevated serum creatinine 06/11/2017  . Abnormal MRI of abdomen 06/11/2017  . HLD (hyperlipidemia) 06/05/2017  . Dysthymia 06/05/2017  . Anxiety 06/05/2017  . Right upper quadrant pain 06/05/2017   Social History   Tobacco Use  . Smoking status: Never Smoker  . Smokeless tobacco: Never Used  Substance Use Topics  . Alcohol use: No  . Drug use: No   Current Medications and Allergies   Current Outpatient Medications:  .  ALPRAZolam (XANAX) 1 MG tablet, Take 1 tablet (1 mg total) by mouth at bedtime., Disp: 90 tablet, Rfl: 0 .  BYSTOLIC 10 MG tablet, TAKE 1 TABLET BY MOUTH EVERY DAY, Disp: 90 tablet, Rfl: 0 .  diltiazem (CARDIZEM CD) 120 MG 24 hr capsule, TAKE 2 CAPSULES (240 MG TOTAL) BY MOUTH DAILY., Disp: 180 capsule, Rfl: 0 .  escitalopram (LEXAPRO) 20 MG tablet, Take 1 tablet (20 mg total) by mouth daily., Disp: 90 tablet, Rfl: 3 .  Flaxseed, Linseed, (FLAXSEED OIL) 1200 MG CAPS, Take 1 capsule by mouth daily., Disp: , Rfl:  .  hydrochlorothiazide (HYDRODIURIL) 25 MG tablet, TAKE 1 TABLET BY MOUTH EVERY DAY, Disp: 90 tablet, Rfl: 2 .  KLOR-CON M20 20 MEQ tablet, TAKE 1 TABLET BY MOUTH EVERY DAY, Disp: 90 tablet, Rfl: 1 .  Multiple  Vitamin (MULTI-VITAMIN DAILY PO), Take by mouth daily., Disp: , Rfl:  .  XARELTO 15 MG TABS tablet, TAKE 1 TABLET BY MOUTH EVERY DAY, Disp: 30 tablet, Rfl: 4  Allergies  Allergen Reactions  . Crestor [Rosuvastatin] Other (See Comments)    Leg pain    Review of Systems   Pertinent items are noted in the HPI. Otherwise, a complete ROS is negative.  Vitals  There were no vitals filed for this visit.   There is no height or weight on file to calculate BMI.  Physical Exam   Physical Exam  Results for orders placed or performed in visit on 01/10/18  CBC with Differential/Platelet  Result Value Ref Range   WBC 6.4 4.0 - 10.5 K/uL   RBC 4.06 3.87 - 5.11 Mil/uL   Hemoglobin 13.2 12.0 - 15.0 g/dL   HCT 38.5 36.0 - 46.0 %   MCV 94.9 78.0 - 100.0 fl   MCHC 34.2 30.0 - 36.0 g/dL   RDW 12.3 11.5 - 15.5 %   Platelets 362.0 150.0 - 400.0 K/uL   Neutrophils Relative % 49.6 43.0 - 77.0 %   Lymphocytes Relative 39.0 12.0 - 46.0 %   Monocytes Relative 7.9 3.0 - 12.0 %   Eosinophils Relative 2.5 0.0 - 5.0 %   Basophils Relative 1.0 0.0 - 3.0 %   Neutro Abs 3.2 1.4 - 7.7 K/uL   Lymphs  Abs 2.5 0.7 - 4.0 K/uL   Monocytes Absolute 0.5 0.1 - 1.0 K/uL   Eosinophils Absolute 0.2 0.0 - 0.7 K/uL   Basophils Absolute 0.1 0.0 - 0.1 K/uL  Comprehensive metabolic panel  Result Value Ref Range   Sodium 139 135 - 145 mEq/L   Potassium 4.2 3.5 - 5.1 mEq/L   Chloride 99 96 - 112 mEq/L   CO2 31 19 - 32 mEq/L   Glucose, Bld 94 70 - 99 mg/dL   BUN 18 6 - 23 mg/dL   Creatinine, Ser 1.15 0.40 - 1.20 mg/dL   Total Bilirubin 0.4 0.2 - 1.2 mg/dL   Alkaline Phosphatase 55 39 - 117 U/L   AST 13 0 - 37 U/L   ALT 8 0 - 35 U/L   Total Protein 7.1 6.0 - 8.3 g/dL   Albumin 4.0 3.5 - 5.2 g/dL   Calcium 9.8 8.4 - 10.5 mg/dL   GFR 50.06 (L) >60.00 mL/min  Lipid panel  Result Value Ref Range   Cholesterol 246 (H) 0 - 200 mg/dL   Triglycerides 245.0 (H) 0.0 - 149.0 mg/dL   HDL 61.30 >39.00 mg/dL   VLDL 49.0 (H)  0.0 - 40.0 mg/dL   Total CHOL/HDL Ratio 4    NonHDL 184.89   LDL cholesterol, direct  Result Value Ref Range   Direct LDL 147.0 mg/dL    Assessment and Plan   There are no diagnoses linked to this encounter.  . Orders and follow up as documented in [place], reviewed diet, exercise and weight control, cardiovascular risk and specific lipid/LDL goals reviewed, reviewed medications and side effects in detail.  . Reviewed expectations re: course of current medical issues. . Outlined signs and symptoms indicating need for more acute intervention. . Patient verbalized understanding and all questions were answered. . Patient received an After Visit Summary.  *** CMA served as Education administrator during this visit. History, Physical, and Plan performed by medical provider. The above documentation has been reviewed and is accurate and complete. Briscoe Deutscher, D.O.  Briscoe Deutscher, DO [place], [place] 10/03/2018

## 2018-10-06 ENCOUNTER — Other Ambulatory Visit: Payer: Self-pay | Admitting: Family Medicine

## 2018-10-06 DIAGNOSIS — F341 Dysthymic disorder: Secondary | ICD-10-CM

## 2018-10-07 ENCOUNTER — Encounter: Payer: Self-pay | Admitting: Family Medicine

## 2018-10-07 DIAGNOSIS — I10 Essential (primary) hypertension: Secondary | ICD-10-CM | POA: Insufficient documentation

## 2018-10-08 ENCOUNTER — Other Ambulatory Visit: Payer: Self-pay

## 2018-10-08 ENCOUNTER — Telehealth: Payer: Self-pay

## 2018-10-08 ENCOUNTER — Telehealth: Payer: Self-pay | Admitting: Family Medicine

## 2018-10-08 ENCOUNTER — Other Ambulatory Visit: Payer: Self-pay | Admitting: *Deleted

## 2018-10-08 DIAGNOSIS — R7989 Other specified abnormal findings of blood chemistry: Secondary | ICD-10-CM

## 2018-10-08 DIAGNOSIS — Z1159 Encounter for screening for other viral diseases: Secondary | ICD-10-CM

## 2018-10-08 DIAGNOSIS — C439 Malignant melanoma of skin, unspecified: Secondary | ICD-10-CM

## 2018-10-08 NOTE — Telephone Encounter (Signed)
Copied from [place] [id number]. Topic: Quick Communication - Lab Results (Clinic Use ONLY) >> Oct 08, 2018 11:35 AM Marian Sorrow, LPN wrote: Called patient to inform them of 10/03/2018 lab results. When patient returns call, triage nurse may disclose results.   Patient would like a call back with Lab results please Ph# [phone]

## 2018-10-08 NOTE — Telephone Encounter (Signed)
[place] - 2/12 - 11:00 with Dr Denna Haggard - phone number is [phone]   Have called both numbers need to talk to patient before patient given this information.

## 2018-10-08 NOTE — Telephone Encounter (Signed)
Results given and documented in result note. 

## 2018-10-09 ENCOUNTER — Encounter: Payer: Self-pay | Admitting: Family Medicine

## 2018-10-09 NOTE — Telephone Encounter (Signed)
Called and sent mychart message

## 2018-10-09 NOTE — Telephone Encounter (Signed)
Patient called gave information

## 2018-10-09 NOTE — Telephone Encounter (Signed)
This encounter was created in error - please disregard.

## 2018-10-10 DIAGNOSIS — L821 Other seborrheic keratosis: Secondary | ICD-10-CM | POA: Diagnosis not present

## 2018-10-10 DIAGNOSIS — D229 Melanocytic nevi, unspecified: Secondary | ICD-10-CM | POA: Diagnosis not present

## 2018-10-10 DIAGNOSIS — C4372 Malignant melanoma of left lower limb, including hip: Secondary | ICD-10-CM | POA: Diagnosis not present

## 2018-10-11 ENCOUNTER — Telehealth: Payer: Self-pay | Admitting: Family Medicine

## 2018-10-11 NOTE — Telephone Encounter (Signed)
See note  Copied from [place] [id number]. Topic: General - Other >> Oct 11, 2018 12:26 PM Jordan Ware wrote:  Pt said at her last vist it was discuss that she would start back on Rosuvastatin Crestor and she will ned a RX sent to the pharmacy she also req that it be 5mg    [facility]

## 2018-10-12 NOTE — Telephone Encounter (Signed)
Please see message and advise 

## 2018-10-13 MED ORDER — ROSUVASTATIN CALCIUM 5 MG PO TABS: 5.0000 mg | ORAL_TABLET | Freq: Every day | ORAL | 3 refills | Status: DC

## 2018-10-13 NOTE — Addendum Note (Signed)
Addended by: Briscoe Deutscher R on: 10/13/2018 06:57 PM   Modules accepted: Orders

## 2018-10-13 NOTE — Telephone Encounter (Signed)
Completed.

## 2018-10-17 ENCOUNTER — Other Ambulatory Visit (INDEPENDENT_AMBULATORY_CARE_PROVIDER_SITE_OTHER): Payer: Medicare Other

## 2018-10-17 DIAGNOSIS — R7989 Other specified abnormal findings of blood chemistry: Secondary | ICD-10-CM | POA: Diagnosis not present

## 2018-10-17 DIAGNOSIS — Z1159 Encounter for screening for other viral diseases: Secondary | ICD-10-CM | POA: Diagnosis not present

## 2018-10-17 LAB — COMPREHENSIVE METABOLIC PANEL
ALT: 13 U/L (ref 0–35)
AST: 16 U/L (ref 0–37)
Albumin: 4.4 g/dL (ref 3.5–5.2)
Alkaline Phosphatase: 69 U/L (ref 39–117)
BUN: 19 mg/dL (ref 6–23)
CO2: 30 mEq/L (ref 19–32)
Calcium: 10 mg/dL (ref 8.4–10.5)
Chloride: 96 mEq/L (ref 96–112)
Creatinine, Ser: 1.25 mg/dL — ABNORMAL HIGH (ref 0.40–1.20)
GFR: 42.68 mL/min — ABNORMAL LOW (ref >60.00)
Glucose, Bld: 90 mg/dL (ref 70–99)
Potassium: 3.7 mEq/L (ref 3.5–5.1)
Sodium: 137 mEq/L (ref 135–145)
Total Bilirubin: 0.6 mg/dL (ref 0.2–1.2)
Total Protein: 7.6 g/dL (ref 6.0–8.3)

## 2018-10-18 LAB — HEPATITIS C ANTIBODY
Hepatitis C Ab: NONREACTIVE
SIGNAL TO CUT-OFF: 0.01 (ref <1.00)

## 2018-10-27 ENCOUNTER — Other Ambulatory Visit: Payer: Self-pay | Admitting: Family Medicine

## 2018-10-27 DIAGNOSIS — I4819 Other persistent atrial fibrillation: Secondary | ICD-10-CM

## 2018-10-31 ENCOUNTER — Encounter: Payer: Self-pay | Admitting: Family Medicine

## 2018-10-31 ENCOUNTER — Ambulatory Visit (INDEPENDENT_AMBULATORY_CARE_PROVIDER_SITE_OTHER): Payer: Medicare Other | Admitting: Family Medicine

## 2018-10-31 VITALS — BP 118/66 | HR 81 | Temp 97.9°F | Ht 64.25 in | Wt 238.0 lb

## 2018-10-31 DIAGNOSIS — Z0000 Encounter for general adult medical examination without abnormal findings: Secondary | ICD-10-CM | POA: Diagnosis not present

## 2018-10-31 NOTE — Progress Notes (Signed)
Subjective:    Jordan Ware is a 67 y.o. female who presents for Medicare Annual (Subsequent) preventive examination.  Review of Systems  Constitutional: Negative for chills, fever, malaise/fatigue and weight loss.  Respiratory: Negative for cough, shortness of breath and wheezing.   Cardiovascular: Negative for chest pain, palpitations and leg swelling.  Gastrointestinal: Negative for abdominal pain, constipation, diarrhea, nausea and vomiting.  Genitourinary: Negative for dysuria and urgency.  Musculoskeletal: Positive for joint pain. Negative for myalgias.  Skin: Negative for rash.  Neurological: Negative for dizziness and headaches.  Psychiatric/Behavioral: Negative for depression, substance abuse and suicidal ideas. The patient is not nervous/anxious.    Objective:   Vitals: BP 118/66   Pulse 81   Temp 97.9 F (36.6 C) (Oral)   Ht 5' 4.25" (1.632 m)   Wt 238 lb (108 kg)   SpO2 95%   BMI 40.54 kg/m   Body mass index is 40.54 kg/m.  Physical Exam Vitals signs and nursing note reviewed.  Constitutional:      General: She is not in acute distress.    Appearance: She is well-developed.  HENT:     Head: Normocephalic and atraumatic.     Right Ear: External ear normal.     Left Ear: External ear normal.     Nose: Nose normal.  Eyes:     Conjunctiva/sclera: Conjunctivae normal.     Pupils: Pupils are equal, round, and reactive to light.  Neck:     Musculoskeletal: Normal range of motion and neck supple.     Thyroid: No thyromegaly.  Cardiovascular:     Rate and Rhythm: Normal rate and regular rhythm.     Heart sounds: Normal heart sounds.  Pulmonary:     Effort: Pulmonary effort is normal.     Breath sounds: Normal breath sounds.  Abdominal:     General: Bowel sounds are normal.     Palpations: Abdomen is soft.  Musculoskeletal: Normal range of motion.  Lymphadenopathy:     Cervical: No cervical adenopathy.  Skin:    General: Skin is warm and dry.   Capillary Refill: Capillary refill takes less than 2 seconds.  Neurological:     Mental Status: She is alert and oriented to person, place, and time.  Psychiatric:        Behavior: Behavior normal.    Tobacco Social History   Tobacco Use  Smoking Status Never Smoker  Smokeless Tobacco Never Used     Past Medical History:  Diagnosis Date  . Anxiety 06/05/2017  . Atrial fibrillation ([place])   . Dysthymia 06/05/2017  . Gallstones 1998  . HLD (hyperlipidemia) 06/05/2017  . Hypertension    Past Surgical History:  Procedure Laterality Date  . APPENDECTOMY    . CHOLECYSTECTOMY     partial  . HERNIA REPAIR  2005, 2010   Family History  Problem Relation Age of Onset  . Breast cancer Mother 77  . Colon cancer Mother 58  . Prostate cancer Brother   . Rectal cancer Neg Hx    Current Outpatient Medications:  .  ALPRAZolam (XANAX) 1 MG tablet, Take 1 tablet (1 mg total) by mouth at bedtime., Disp: 90 tablet, Rfl: 0 .  BYSTOLIC 10 MG tablet, TAKE 1 TABLET BY MOUTH EVERY DAY, Disp: 90 tablet, Rfl: 0 .  diltiazem (CARDIZEM CD) 120 MG 24 hr capsule, TAKE 2 CAPSULES (240 MG TOTAL) BY MOUTH DAILY. (Patient taking differently: Take 120 mg by mouth daily. ), Disp: 180 capsule, Rfl: 0 .  escitalopram (LEXAPRO) 20 MG tablet, TAKE 1 TABLET BY MOUTH EVERY DAY, Disp: 90 tablet, Rfl: 0 .  Flaxseed, Linseed, (FLAXSEED OIL) 1200 MG CAPS, Take 1 capsule by mouth daily., Disp: , Rfl:  .  hydrochlorothiazide (HYDRODIURIL) 25 MG tablet, TAKE 1 TABLET BY MOUTH EVERY DAY, Disp: 90 tablet, Rfl: 2 .  KLOR-CON M20 20 MEQ tablet, TAKE 1 TABLET BY MOUTH EVERY DAY, Disp: 90 tablet, Rfl: 1 .  Multiple Vitamin (MULTI-VITAMIN DAILY PO), Take by mouth daily., Disp: , Rfl:  .  rosuvastatin (CRESTOR) 5 MG tablet, Take 1 tablet (5 mg total) by mouth daily., Disp: 90 tablet, Rfl: 3 .  XARELTO 15 MG TABS tablet, TAKE 1 TABLET BY MOUTH EVERY DAY, Disp: 30 tablet, Rfl: 2  Activities of Daily Living In your present state of  health, do you have any difficulty performing the following activities: 10/31/2018  Hearing? N  Vision? Y  Difficulty concentrating or making decisions? N  Walking or climbing stairs? Y  Dressing or bathing? N  Doing errands, shopping? N  Preparing Food and eating ? N  Using the Toilet? N  In the past six months, have you accidently leaked urine? N  Do you have problems with loss of bowel control? N  Managing your Medications? N  Managing your Finances? N  Housekeeping or managing your Housekeeping? N  Some recent data might be hidden    Patient Care Team: Briscoe Deutscher, DO as PCP - General (Family Medicine) Assessment:  This is a routine wellness examination for Jordan Ware.  Exercise Activities and Dietary recommendations Current Exercise Habits: The patient does not participate in regular exercise at present, Exercise limited by: orthopedic condition(s)  Fall Risk Fall Risk  10/31/2018 10/03/2018 06/05/2017  Falls in the past year? 1 0 No  Number falls in past yr: 0 0 -  Injury with Fall? 0 0 -  Risk for fall due to : Impaired mobility;Impaired balance/gait - -  Follow up Education provided;Falls prevention discussed;Falls evaluation completed - -   Timed Get Up and Go performed: yes, no concern  Depression Screen PHQ 2/9 Scores 10/31/2018 10/03/2018 06/05/2017  PHQ - 2 Score 0 0 0  PHQ- 9 Score 3 4 -    Cognitive Function MMSE - Mini Mental State Exam 10/31/2018  Orientation to time 5  Orientation to Place 5  Registration 3  Attention/ Calculation 5  Recall 3  Language- name 2 objects 2  Language- repeat 1  Language- follow 3 step command 3  Language- read & follow direction 1  Write a sentence 1  Copy design 1  Total score 30   Immunization History  Administered Date(s) Administered  . Influenza, High Dose Seasonal PF 05/03/2018  . Influenza,inj,Quad PF,6+ Mos 06/05/2017  . Pneumococcal Polysaccharide-23 01/10/2018  . Tdap 02/02/2018   Screening Tests Health  Maintenance  Topic Date Due  . PNA vac Low Risk Adult (2 of 2 - PCV13) 01/11/2019  . MAMMOGRAM  11/29/2019  . COLONOSCOPY  04/18/2021  . TETANUS/TDAP  02/03/2028  . INFLUENZA VACCINE  Completed  . DEXA SCAN  Completed  . Hepatitis C Screening  Completed   Plan:   I have personally reviewed and noted the following in the patient's chart:   . Medical and social history . Use of alcohol, tobacco or illicit drugs  . Current medications and supplements . Functional ability and status . Nutritional status . Physical activity . Advanced directives . List of other physicians . Hospitalizations, surgeries, and ER  visits in previous 12 months . Vitals . Screenings to include cognitive, depression, and falls . Referrals and appointments  In addition, I have reviewed and discussed with patient certain preventive protocols, quality metrics, and best practice recommendations. A written personalized care plan for preventive services as well as general preventive health recommendations were provided to patient. [facility] QUESTIONS ANSWERED AND SCANNED INTO CHART.  Briscoe Deutscher, DO  10/31/2018

## 2018-10-31 NOTE — Patient Instructions (Addendum)
Preventive Care 68 Years and Older, Female Preventive care refers to lifestyle choices and visits with your health care provider that can promote health and wellness. What does preventive care include?  A yearly physical exam. This is also called an annual well check.  Dental exams once or twice a year.  Routine eye exams. Ask your health care provider how often you should have your eyes checked.  Personal lifestyle choices, including: ? Daily care of your teeth and gums. ? Regular physical activity. ? Eating a healthy diet. ? Avoiding tobacco and drug use. ? Limiting alcohol use. ? Practicing safe sex. ? Taking low-dose aspirin every day. ? Taking vitamin and mineral supplements as recommended by your health care provider. What happens during an annual well check? The services and screenings done by your health care provider during your annual well check will depend on your age, overall health, lifestyle risk factors, and family history of disease. Counseling Your health care provider may ask you questions about your:  Alcohol use.  Tobacco use.  Drug use.  Emotional well-being.  Home and relationship well-being.  Sexual activity.  Eating habits.  History of falls.  Memory and ability to understand (cognition).  Work and work Statistician.  Reproductive health.  Screening You may have the following tests or measurements:  Height, weight, and BMI.  Blood pressure.  Lipid and cholesterol levels. These may be checked every 5 years, or more frequently if you are over 30 years old.  Skin check.  Lung cancer screening. You may have this screening every year starting at age 27 if you have a 30-pack-year history of smoking and currently smoke or have quit within the past 15 years.  Colorectal cancer screening. All adults should have this screening starting at age 33 and continuing until age 46. You will have tests every 1-10 years, depending on your results and the  type of screening test. People at increased risk should start screening at an earlier age. Screening tests may include: ? Guaiac-based fecal occult blood testing. ? Fecal immunochemical test (FIT). ? Stool DNA test. ? Virtual colonoscopy. ? Sigmoidoscopy. During this test, a flexible tube with a tiny camera (sigmoidoscope) is used to examine your rectum and lower colon. The sigmoidoscope is inserted through your anus into your rectum and lower colon. ? Colonoscopy. During this test, a long, thin, flexible tube with a tiny camera (colonoscope) is used to examine your entire colon and rectum.  Hepatitis C blood test.  Hepatitis B blood test.  Sexually transmitted disease (STD) testing.  Diabetes screening. This is done by checking your blood sugar (glucose) after you have not eaten for a while (fasting). You may have this done every 1-3 years.  Bone density scan. This is done to screen for osteoporosis. You may have this done starting at age 37.  Mammogram. This may be done every 1-2 years. Talk to your health care provider about how often you should have regular mammograms. Talk with your health care provider about your test results, treatment options, and if necessary, the need for more tests. Vaccines Your health care provider may recommend certain vaccines, such as:  Influenza vaccine. This is recommended every year.  Tetanus, diphtheria, and acellular pertussis (Tdap, Td) vaccine. You may need a Td booster every 10 years.  Varicella vaccine. You may need this if you have not been vaccinated.  Zoster vaccine. You may need this after age 38.  Measles, mumps, and rubella (MMR) vaccine. You may need at least  one dose of MMR if you were born in 1957 or later. You may also need a second dose.  Pneumococcal 13-valent conjugate (PCV13) vaccine. One dose is recommended after age 65.  Pneumococcal polysaccharide (PPSV23) vaccine. One dose is recommended after age 10.  Meningococcal  vaccine. You may need this if you have certain conditions.  Hepatitis A vaccine. You may need this if you have certain conditions or if you travel or work in places where you may be exposed to hepatitis A.  Hepatitis B vaccine. You may need this if you have certain conditions or if you travel or work in places where you may be exposed to hepatitis B.  Haemophilus influenzae type b (Hib) vaccine. You may need this if you have certain conditions. Talk to your health care provider about which screenings and vaccines you need and how often you need them. This information is not intended to replace advice given to you by your health care provider. Make sure you discuss any questions you have with your health care provider. Document Released: 09/11/2015 Document Revised: 10/05/2017 Document Reviewed: 06/16/2015 Elsevier Interactive Patient Education  2019 [place] Maintenance After Age 9 After age 46, you are at a higher risk for certain long-term diseases and infections as well as injuries from falls. Falls are a major cause of broken bones and head injuries in people who are older than age 52. Getting regular preventive care can help to keep you healthy and well. Preventive care includes getting regular testing and making lifestyle changes as recommended by your health care provider. Talk with your health care provider about:  Which screenings and tests you should have. A screening is a test that checks for a disease when you have no symptoms.  A diet and exercise plan that is right for you. What should I know about screenings and tests to prevent falls? Screening and testing are the best ways to find a health problem early. Early diagnosis and treatment give you the best chance of managing medical conditions that are common after age 68. Certain conditions and lifestyle choices may make you more likely to have a fall. Your health care provider may recommend:  Regular vision checks.  Poor vision and conditions such as cataracts can make you more likely to have a fall. If you wear glasses, make sure to get your prescription updated if your vision changes.  Medicine review. Work with your health care provider to regularly review all of the medicines you are taking, including over-the-counter medicines. Ask your health care provider about any side effects that may make you more likely to have a fall. Tell your health care provider if any medicines that you take make you feel dizzy or sleepy.  Osteoporosis screening. Osteoporosis is a condition that causes the bones to get weaker. This can make the bones weak and cause them to break more easily.  Blood pressure screening. Blood pressure changes and medicines to control blood pressure can make you feel dizzy.  Strength and balance checks. Your health care provider may recommend certain tests to check your strength and balance while standing, walking, or changing positions.  Foot health exam. Foot pain and numbness, as well as not wearing proper footwear, can make you more likely to have a fall.  Depression screening. You may be more likely to have a fall if you have a fear of falling, feel emotionally low, or feel unable to do activities that you used to do.  Alcohol use screening.  Using too much alcohol can affect your balance and may make you more likely to have a fall. What actions can I take to lower my risk of falls? General instructions  Talk with your health care provider about your risks for falling. Tell your health care provider if: ? You fall. Be sure to tell your health care provider about all falls, even ones that seem minor. ? You feel dizzy, sleepy, or off-balance.  Take over-the-counter and prescription medicines only as told by your health care provider. These include any supplements.  Eat a healthy diet and maintain a healthy weight. A healthy diet includes low-fat dairy products, low-fat (lean) meats, and  fiber from whole grains, beans, and lots of fruits and vegetables. Home safety  Remove any tripping hazards, such as rugs, cords, and clutter.  Install safety equipment such as grab bars in bathrooms and safety rails on stairs.  Keep rooms and walkways well-lit. Activity   Follow a regular exercise program to stay fit. This will help you maintain your balance. Ask your health care provider what types of exercise are appropriate for you.  If you need a cane or walker, use it as recommended by your health care provider.  Wear supportive shoes that have nonskid soles. Lifestyle  Do not drink alcohol if your health care provider tells you not to drink.  If you drink alcohol, limit how much you have: ? 0-1 drink a day for women. ? 0-2 drinks a day for men.  Be aware of how much alcohol is in your drink. In the [place] one drink equals one typical bottle of beer (12 oz), one-half glass of wine (5 oz), or one shot of hard liquor (1 oz).  Do not use any products that contain nicotine or tobacco, such as cigarettes and e-cigarettes. If you need help quitting, ask your health care provider. Summary  Having a healthy lifestyle and getting preventive care can help to protect your health and wellness after age 59.  Screening and testing are the best way to find a health problem early and help you avoid having a fall. Early diagnosis and treatment give you the best chance for managing medical conditions that are more common for people who are older than age 79.  Falls are a major cause of broken bones and head injuries in people who are older than age 3. Take precautions to prevent a fall at home.  Work with your health care provider to learn what changes you can make to improve your health and wellness and to prevent falls. This information is not intended to replace advice given to you by your health care provider. Make sure you discuss any questions you have with your health care  provider. Document Released: 06/28/2017 Document Revised: 06/28/2017 Document Reviewed: 06/28/2017 Elsevier Interactive Patient Education  2019 [employer].

## 2018-11-01 ENCOUNTER — Other Ambulatory Visit: Payer: Self-pay | Admitting: Dermatology

## 2018-11-01 DIAGNOSIS — C4362 Malignant melanoma of left upper limb, including shoulder: Secondary | ICD-10-CM | POA: Diagnosis not present

## 2018-11-05 ENCOUNTER — Other Ambulatory Visit: Payer: Self-pay | Admitting: Family Medicine

## 2018-11-05 DIAGNOSIS — I4819 Other persistent atrial fibrillation: Secondary | ICD-10-CM

## 2018-11-14 DIAGNOSIS — Z4802 Encounter for removal of sutures: Secondary | ICD-10-CM | POA: Diagnosis not present

## 2018-11-26 ENCOUNTER — Other Ambulatory Visit: Payer: Self-pay | Admitting: Family Medicine

## 2018-11-26 DIAGNOSIS — I4819 Other persistent atrial fibrillation: Secondary | ICD-10-CM

## 2018-12-03 ENCOUNTER — Other Ambulatory Visit: Payer: Self-pay | Admitting: Family Medicine

## 2018-12-03 DIAGNOSIS — F419 Anxiety disorder, unspecified: Secondary | ICD-10-CM

## 2018-12-03 NOTE — Telephone Encounter (Signed)
Last OV 10/31/2018 Last refill 09/07/2018 #90/0 Next OV 03/27/2019

## 2018-12-29 ENCOUNTER — Other Ambulatory Visit: Payer: Self-pay | Admitting: Family Medicine

## 2018-12-29 DIAGNOSIS — F341 Dysthymic disorder: Secondary | ICD-10-CM

## 2019-01-02 ENCOUNTER — Ambulatory Visit: Payer: Medicare Other | Admitting: Family Medicine

## 2019-01-28 ENCOUNTER — Other Ambulatory Visit: Payer: Self-pay | Admitting: Family Medicine

## 2019-01-28 DIAGNOSIS — I4819 Other persistent atrial fibrillation: Secondary | ICD-10-CM

## 2019-02-03 ENCOUNTER — Other Ambulatory Visit: Payer: Self-pay | Admitting: Family Medicine

## 2019-02-03 DIAGNOSIS — I4819 Other persistent atrial fibrillation: Secondary | ICD-10-CM

## 2019-02-28 ENCOUNTER — Other Ambulatory Visit: Payer: Self-pay | Admitting: Family Medicine

## 2019-02-28 DIAGNOSIS — F419 Anxiety disorder, unspecified: Secondary | ICD-10-CM

## 2019-02-28 NOTE — Telephone Encounter (Signed)
Rx refill request Last fill 12/03/18  #90/0 Last OV 10/31/18

## 2019-03-12 ENCOUNTER — Other Ambulatory Visit: Payer: Self-pay | Admitting: Family Medicine

## 2019-03-12 DIAGNOSIS — I10 Essential (primary) hypertension: Secondary | ICD-10-CM

## 2019-03-12 NOTE — Telephone Encounter (Signed)
Rx request Last fill  06/25/18  #90/2 Last OV 10/31/18 Next OV 03/27/19

## 2019-03-14 ENCOUNTER — Other Ambulatory Visit: Payer: Self-pay | Admitting: Family Medicine

## 2019-03-14 DIAGNOSIS — I10 Essential (primary) hypertension: Secondary | ICD-10-CM

## 2019-03-27 ENCOUNTER — Ambulatory Visit (INDEPENDENT_AMBULATORY_CARE_PROVIDER_SITE_OTHER): Payer: Medicare Other | Admitting: Family Medicine

## 2019-03-27 ENCOUNTER — Other Ambulatory Visit: Payer: Self-pay

## 2019-03-27 ENCOUNTER — Other Ambulatory Visit (INDEPENDENT_AMBULATORY_CARE_PROVIDER_SITE_OTHER): Payer: Medicare Other

## 2019-03-27 ENCOUNTER — Encounter: Payer: Self-pay | Admitting: Family Medicine

## 2019-03-27 VITALS — Ht 64.25 in | Wt 238.0 lb

## 2019-03-27 DIAGNOSIS — R600 Localized edema: Secondary | ICD-10-CM

## 2019-03-27 DIAGNOSIS — Z20828 Contact with and (suspected) exposure to other viral communicable diseases: Secondary | ICD-10-CM

## 2019-03-27 DIAGNOSIS — F341 Dysthymic disorder: Secondary | ICD-10-CM | POA: Diagnosis not present

## 2019-03-27 DIAGNOSIS — Z20822 Contact with and (suspected) exposure to covid-19: Secondary | ICD-10-CM

## 2019-03-27 DIAGNOSIS — Z0184 Encounter for antibody response examination: Secondary | ICD-10-CM | POA: Diagnosis not present

## 2019-03-27 MED ORDER — ESCITALOPRAM OXALATE 20 MG PO TABS: 20.0000 mg | ORAL_TABLET | Freq: Every day | ORAL | 1 refills | Status: DC

## 2019-03-27 NOTE — Progress Notes (Signed)
Virtual Visit via Video   Due to the COVID-19 pandemic, this visit was completed with telemedicine (audio/video) technology to reduce patient and provider exposure as well as to preserve personal protective equipment.   I connected with Jordan Ware by a video enabled telemedicine application and verified that I am speaking with the correct person using two identifiers. Location patient: Home Location provider: [facility], Office Persons participating in the virtual visit: Kennice Finnie, Briscoe Deutscher, DO   I discussed the limitations of evaluation and management by telemedicine and the availability of in person appointments. The patient expressed understanding and agreed to proceed.  Care Team   Patient Care Team: Briscoe Deutscher, DO as PCP - General (Family Medicine)  Subjective:   HPI:   Left greater than right foot swelling. Has been on going thing for several years. She thinks that it may be from the Diltiazem and would like to see about changing that. She has pain in toes with swelling.   She would like to have testing for Covid antibodies. In April/May she had symptoms of fever/chils with body aches. No respiratory symptoms.   Review of Systems  Constitutional: Negative for chills and fever.  HENT: Negative for hearing loss, sore throat and tinnitus.   Eyes: Negative for blurred vision and double vision.  Respiratory: Negative for cough and wheezing.   Cardiovascular: Positive for leg swelling. Negative for chest pain and palpitations.       Selling in feet only    Gastrointestinal: Negative for heartburn and nausea.  Genitourinary: Negative for dysuria and urgency.  Musculoskeletal: Negative for myalgias.  Neurological: Negative for dizziness and headaches.  Endo/Heme/Allergies: Does not bruise/bleed easily.  Psychiatric/Behavioral: Negative for depression and suicidal ideas.     Patient Active Problem List   Diagnosis Date Noted  . Morbid obesity ([place]),  BMI > 35 with comorbid conditions 10/07/2018  . Essential hypertension 10/07/2018  . Leg weakness, bilateral 02/21/2018  . Special screening for malignant neoplasms, colon 02/01/2018  . Chronic anticoagulation 02/01/2018  . Family history of colon cancer in mother 02/01/2018  . Elevated serum creatinine 06/11/2017  . Abnormal MRI of abdomen 06/11/2017  . Mixed hyperlipidemia 06/05/2017  . Dysthymia 06/05/2017  . Anxiety 06/05/2017  . Right upper quadrant pain 06/05/2017    Social History   Tobacco Use  . Smoking status: Never Smoker  . Smokeless tobacco: Never Used  Substance Use Topics  . Alcohol use: No    Current Outpatient Medications:  .  ALPRAZolam (XANAX) 1 MG tablet, TAKE 1 TABLET (1 MG TOTAL) BY MOUTH AT BEDTIME., Disp: 90 tablet, Rfl: 0 .  BYSTOLIC 10 MG tablet, TAKE 1 TABLET BY MOUTH EVERY DAY, Disp: 90 tablet, Rfl: 0 .  diltiazem (CARDIZEM CD) 120 MG 24 hr capsule, TAKE 2 CAPSULES (240 MG TOTAL) BY MOUTH DAILY, or as directed, Disp: 180 capsule, Rfl: 1 .  escitalopram (LEXAPRO) 20 MG tablet, Take 1 tablet (20 mg total) by mouth daily., Disp: 90 tablet, Rfl: 1 .  Flaxseed, Linseed, (FLAXSEED OIL) 1200 MG CAPS, Take 1 capsule by mouth daily., Disp: , Rfl:  .  hydrochlorothiazide (HYDRODIURIL) 25 MG tablet, TAKE 1 TABLET BY MOUTH EVERY DAY, Disp: 90 tablet, Rfl: 2 .  KLOR-CON M20 20 MEQ tablet, TAKE 1 TABLET BY MOUTH EVERY DAY, Disp: 90 tablet, Rfl: 1 .  Multiple Vitamin (MULTI-VITAMIN DAILY PO), Take by mouth daily., Disp: , Rfl:  .  rosuvastatin (CRESTOR) 5 MG tablet, Take 1 tablet (5 mg total)  by mouth daily., Disp: 90 tablet, Rfl: 3 .  XARELTO 15 MG TABS tablet, TAKE 1 TABLET BY MOUTH EVERY DAY, Disp: 30 tablet, Rfl: 2  Allergies  Allergen Reactions  . Crestor [Rosuvastatin] Other (See Comments)    Leg pain     Objective:   VITALS: Per patient if applicable, see vitals. GENERAL: Alert, appears well and in no acute distress. HEENT: Atraumatic, conjunctiva  clear, no obvious abnormalities on inspection of external nose and ears. NECK: Normal movements of the head and neck. CARDIOPULMONARY: No increased WOB. Speaking in clear sentences. I:E ratio WNL.  MS: Moves all visible extremities without noticeable abnormality. PSYCH: Pleasant and cooperative, well-groomed. Speech normal rate and rhythm. Affect is appropriate. Insight and judgement are appropriate. Attention is focused, linear, and appropriate.  NEURO: CN grossly intact. Oriented as arrived to appointment on time with no prompting. Moves both UE equally.  SKIN: No obvious lesions, wounds, erythema, or cyanosis noted on face or hands.  Depression screen [facility] 2/9 03/27/2019 10/31/2018 10/03/2018  Decreased Interest 0 0 0  Down, Depressed, Hopeless 0 0 0  PHQ - 2 Score 0 0 0  Altered sleeping 3 3 3   Tired, decreased energy 0 0 1  Change in appetite 0 0 0  Feeling bad or failure about yourself  0 0 0  Trouble concentrating 0 0 0  Moving slowly or fidgety/restless 0 0 0  Suicidal thoughts 0 0 0  PHQ-9 Score 3 3 4   Difficult doing work/chores Not difficult at all Not difficult at all Not difficult at all    Assessment and Plan:   Bianna was seen today for follow-up.  Diagnoses and all orders for this visit:  Exposure to Covid-19 Virus -     SAR CoV2 Serology (COVID 19)AB(IGG)IA; Future  Dysthymia Comments: . Orders: -     escitalopram (LEXAPRO) 20 MG tablet; Take 1 tablet (20 mg total) by mouth daily.  Bilateral lower extremity edema Comments: If stable, plan is to DC Cardizem and consider increasing Bystolic +/- change HCTZ to Lasix. Orders: -     TSH; Future -     Magnesium; Future -     Comprehensive metabolic panel; Future -     CBC with Differential/Platelet; Future -     Brain natriuretic peptide; Future    . COVID-19 Education: The signs and symptoms of COVID-19 were discussed with the patient and how to seek care for testing if needed. The importance of social  distancing was discussed today. . Reviewed expectations re: course of current medical issues. . Discussed self-management of symptoms. . Outlined signs and symptoms indicating need for more acute intervention. . Patient verbalized understanding and all questions were answered. Marland Kitchen Health Maintenance issues including appropriate healthy diet, exercise, and smoking avoidance were discussed with patient. . See orders for this visit as documented in the electronic medical record.  Briscoe Deutscher, DO  Records requested if needed. Time spent: 25 minutes, of which >50% was spent in obtaining information about her symptoms, reviewing her previous labs, evaluations, and treatments, counseling her about her condition (please see the discussed topics above), and developing a plan to further investigate it; she had a number of questions which I addressed.

## 2019-03-28 LAB — CBC WITH DIFFERENTIAL/PLATELET
Basophils Absolute: 0.1 10*3/uL (ref 0.0–0.1)
Basophils Relative: 1 % (ref 0.0–3.0)
Eosinophils Absolute: 0.1 10*3/uL (ref 0.0–0.7)
Eosinophils Relative: 1.6 % (ref 0.0–5.0)
HCT: 37.4 % (ref 36.0–46.0)
Hemoglobin: 12.5 g/dL (ref 12.0–15.0)
Lymphocytes Relative: 33.2 % (ref 12.0–46.0)
Lymphs Abs: 2.5 10*3/uL (ref 0.7–4.0)
MCHC: 33.4 g/dL (ref 30.0–36.0)
MCV: 94.3 fl (ref 78.0–100.0)
Monocytes Absolute: 0.4 10*3/uL (ref 0.1–1.0)
Monocytes Relative: 5 % (ref 3.0–12.0)
Neutro Abs: 4.5 10*3/uL (ref 1.4–7.7)
Neutrophils Relative %: 59.2 % (ref 43.0–77.0)
Platelets: 359 10*3/uL (ref 150.0–400.0)
RBC: 3.97 Mil/uL (ref 3.87–5.11)
RDW: 14.5 % (ref 11.5–15.5)
WBC: 7.6 10*3/uL (ref 4.0–10.5)

## 2019-03-28 LAB — COMPREHENSIVE METABOLIC PANEL
ALT: 11 U/L (ref 0–35)
AST: 18 U/L (ref 0–37)
Albumin: 4.3 g/dL (ref 3.5–5.2)
Alkaline Phosphatase: 67 U/L (ref 39–117)
BUN: 20 mg/dL (ref 6–23)
CO2: 32 mEq/L (ref 19–32)
Calcium: 10 mg/dL (ref 8.4–10.5)
Chloride: 98 mEq/L (ref 96–112)
Creatinine, Ser: 1.37 mg/dL — ABNORMAL HIGH (ref 0.40–1.20)
GFR: 38.35 mL/min — ABNORMAL LOW (ref >60.00)
Glucose, Bld: 103 mg/dL — ABNORMAL HIGH (ref 70–99)
Potassium: 3.9 mEq/L (ref 3.5–5.1)
Sodium: 141 mEq/L (ref 135–145)
Total Bilirubin: 0.4 mg/dL (ref 0.2–1.2)
Total Protein: 7.4 g/dL (ref 6.0–8.3)

## 2019-03-28 LAB — MAGNESIUM: Magnesium: 1.9 mg/dL (ref 1.5–2.5)

## 2019-03-28 LAB — SAR COV2 SEROLOGY (COVID19)AB(IGG),IA: SARS CoV2 AB IGG: NEGATIVE

## 2019-03-28 LAB — TSH: TSH: 2.91 u[IU]/mL (ref 0.35–4.50)

## 2019-03-28 LAB — BRAIN NATRIURETIC PEPTIDE: Pro B Natriuretic peptide (BNP): 93 pg/mL (ref 0.0–100.0)

## 2019-04-17 ENCOUNTER — Other Ambulatory Visit: Payer: Self-pay | Admitting: Family Medicine

## 2019-04-17 DIAGNOSIS — I4819 Other persistent atrial fibrillation: Secondary | ICD-10-CM

## 2019-04-17 MED ORDER — NEBIVOLOL HCL 10 MG PO TABS: 10.0000 mg | ORAL_TABLET | Freq: Every day | ORAL | 0 refills | Status: DC

## 2019-04-17 NOTE — Telephone Encounter (Signed)
Medication Refill - Medication: BYSTOLIC 10 MG tablet   Has the patient contacted their pharmacy? Yes.  Pt states she has been taking 2 pills a day after appt with PCP. Please advise.  (Agent: If no, request that the patient contact the pharmacy for the refill.) (Agent: If yes, when and what did the pharmacy advise?)  Preferred Pharmacy (with phone number or street name):  [id number] [address], [place]  [place] [address]  Phone: [phone] Fax: [phone]  Not a 24 hour pharmacy; exact hours not known.     Agent: Please be advised that RX refills may take up to 3 business days. We ask that you follow-up with your pharmacy.

## 2019-04-26 ENCOUNTER — Other Ambulatory Visit: Payer: Self-pay | Admitting: Family Medicine

## 2019-04-26 DIAGNOSIS — I4819 Other persistent atrial fibrillation: Secondary | ICD-10-CM

## 2019-04-30 ENCOUNTER — Ambulatory Visit (INDEPENDENT_AMBULATORY_CARE_PROVIDER_SITE_OTHER): Payer: Medicare Other | Admitting: Family Medicine

## 2019-04-30 ENCOUNTER — Encounter: Payer: Self-pay | Admitting: Family Medicine

## 2019-04-30 VITALS — Temp 97.6°F | Ht 64.25 in | Wt 238.0 lb

## 2019-04-30 DIAGNOSIS — Z7901 Long term (current) use of anticoagulants: Secondary | ICD-10-CM | POA: Diagnosis not present

## 2019-04-30 DIAGNOSIS — R600 Localized edema: Secondary | ICD-10-CM | POA: Diagnosis not present

## 2019-04-30 DIAGNOSIS — I10 Essential (primary) hypertension: Secondary | ICD-10-CM | POA: Diagnosis not present

## 2019-04-30 MED ORDER — BYSTOLIC 20 MG PO TABS: ORAL_TABLET | ORAL | 1 refills | Status: DC

## 2019-04-30 NOTE — Patient Instructions (Signed)
Health Maintenance Due  Topic Date Due  . PNA vac Low Risk Adult (2 of 2 - PCV13) 01/11/2019  Patient said that she received both shots 2017 & 2019  Depression screen [facility] 2/9 03/27/2019 10/31/2018 10/03/2018  Decreased Interest 0 0 0  Down, Depressed, Hopeless 0 0 0  PHQ - 2 Score 0 0 0  Altered sleeping 3 3 3   Tired, decreased energy 0 0 1  Change in appetite 0 0 0  Feeling bad or failure about yourself  0 0 0  Trouble concentrating 0 0 0  Moving slowly or fidgety/restless 0 0 0  Suicidal thoughts 0 0 0  PHQ-9 Score 3 3 4   Difficult doing work/chores Not difficult at all Not difficult at all Not difficult at all

## 2019-04-30 NOTE — Progress Notes (Signed)
Virtual Visit via Video   Due to the COVID-19 pandemic, this visit was completed with telemedicine (audio/video) technology to reduce patient and provider exposure as well as to preserve personal protective equipment.   I connected with Emelda Fear by a video enabled telemedicine application and verified that I am speaking with the correct person using two identifiers. Location patient: Home Location provider: [facility], Office Persons participating in the virtual visit: Trevor Kehn, Briscoe Deutscher, DO   I discussed the limitations of evaluation and management by telemedicine and the availability of in person appointments. The patient expressed understanding and agreed to proceed.  Care Team   Patient Care Team: Briscoe Deutscher, DO as PCP - General (Family Medicine)  Subjective:   HPI: Calcium channel blocker stopped at last visit.  Bystolic increased.  Over the past month the patient has noticed increased provement in her edema.  She rates this from a 5 out of 10 to a 2 out of 10 improvement.  Heart rate is in the 60s to 70s.  Blood pressure remains unchanged.  Tolerating medication well.  Still taking the hydrochlorothiazide.  Review of Systems  Constitutional: Negative for chills, fever, malaise/fatigue and weight loss.  Respiratory: Negative for cough, shortness of breath and wheezing.   Cardiovascular: Negative for chest pain, palpitations and leg swelling.  Gastrointestinal: Negative for abdominal pain, constipation, diarrhea, nausea and vomiting.  Genitourinary: Negative for dysuria and urgency.  Musculoskeletal: Negative for joint pain and myalgias.  Skin: Negative for rash.  Neurological: Negative for dizziness and headaches.  Psychiatric/Behavioral: Negative for depression, substance abuse and suicidal ideas. The patient is not nervous/anxious.     Patient Active Problem List   Diagnosis Date Noted  . Morbid obesity ([place]), BMI > 35 with comorbid conditions  10/07/2018  . Essential hypertension 10/07/2018  . Leg weakness, bilateral 02/21/2018  . Special screening for malignant neoplasms, colon 02/01/2018  . Chronic anticoagulation 02/01/2018  . Family history of colon cancer in mother 02/01/2018  . Elevated serum creatinine 06/11/2017  . Abnormal MRI of abdomen 06/11/2017  . Mixed hyperlipidemia 06/05/2017  . Dysthymia 06/05/2017  . Anxiety 06/05/2017  . Right upper quadrant pain 06/05/2017    Social History   Tobacco Use  . Smoking status: Never Smoker  . Smokeless tobacco: Never Used  Substance Use Topics  . Alcohol use: No   Current Outpatient Medications:  .  ALPRAZolam (XANAX) 1 MG tablet, TAKE 1 TABLET (1 MG TOTAL) BY MOUTH AT BEDTIME., Disp: 90 tablet, Rfl: 0 .  escitalopram (LEXAPRO) 20 MG tablet, Take 1 tablet (20 mg total) by mouth daily., Disp: 90 tablet, Rfl: 1 .  Flaxseed, Linseed, (FLAXSEED OIL) 1200 MG CAPS, Take 1 capsule by mouth daily., Disp: , Rfl:  .  hydrochlorothiazide (HYDRODIURIL) 25 MG tablet, TAKE 1 TABLET BY MOUTH EVERY DAY, Disp: 90 tablet, Rfl: 2 .  KLOR-CON M20 20 MEQ tablet, TAKE 1 TABLET BY MOUTH EVERY DAY, Disp: 90 tablet, Rfl: 1 .  Multiple Vitamin (MULTI-VITAMIN DAILY PO), Take by mouth daily., Disp: , Rfl:  .  nebivolol (BYSTOLIC) 10 MG tablet, Take 1 tablet (10 mg total) by mouth daily., Disp: 90 tablet, Rfl: 0 .  rosuvastatin (CRESTOR) 5 MG tablet, Take 1 tablet (5 mg total) by mouth daily., Disp: 90 tablet, Rfl: 3 .  XARELTO 15 MG TABS tablet, TAKE 1 TABLET BY MOUTH EVERY DAY, Disp: 30 tablet, Rfl: 2  Allergies  Allergen Reactions  . Crestor [Rosuvastatin] Other (See Comments)  Leg pain     Objective:   VITALS: Per patient if applicable, see vitals. GENERAL: Alert, appears well and in no acute distress. HEENT: Atraumatic, conjunctiva clear, no obvious abnormalities on inspection of external nose and ears. NECK: Normal movements of the head and neck. CARDIOPULMONARY: No increased WOB.  Speaking in clear sentences. I:E ratio WNL.  MS: Moves all visible extremities without noticeable abnormality. PSYCH: Pleasant and cooperative, well-groomed. Speech normal rate and rhythm. Affect is appropriate. Insight and judgement are appropriate. Attention is focused, linear, and appropriate.  NEURO: CN grossly intact. Oriented as arrived to appointment on time with no prompting. Moves both UE equally.  SKIN: No obvious lesions, wounds, erythema, or cyanosis noted on face or hands.  Depression screen [facility] 2/9 03/27/2019 10/31/2018 10/03/2018  Decreased Interest 0 0 0  Down, Depressed, Hopeless 0 0 0  PHQ - 2 Score 0 0 0  Altered sleeping 3 3 3   Tired, decreased energy 0 0 1  Change in appetite 0 0 0  Feeling bad or failure about yourself  0 0 0  Trouble concentrating 0 0 0  Moving slowly or fidgety/restless 0 0 0  Suicidal thoughts 0 0 0  PHQ-9 Score 3 3 4   Difficult doing work/chores Not difficult at all Not difficult at all Not difficult at all    Assessment and Plan:   Jordan Ware was seen today for follow-up.  Diagnoses and all orders for this visit:  Essential hypertension -     Nebivolol HCl (BYSTOLIC) 20 MG TABS; Take one tablet by mouth daily  Morbid obesity ([place]), BMI > 35 with comorbid conditions  Chronic anticoagulation  Lower extremity edema Comments: Improving.    Marland Kitchen COVID-19 Education: The signs and symptoms of COVID-19 were discussed with the patient and how to seek care for testing if needed. The importance of social distancing was discussed today. . Reviewed expectations re: course of current medical issues. . Discussed self-management of symptoms. . Outlined signs and symptoms indicating need for more acute intervention. . Patient verbalized understanding and all questions were answered. Marland Kitchen Health Maintenance issues including appropriate healthy diet, exercise, and smoking avoidance were discussed with patient. . See orders for this visit as documented in the  electronic medical record.  Briscoe Deutscher, DO  Records requested if needed. Time spent: 15 minutes, of which >50% was spent in obtaining information about her symptoms, reviewing her previous labs, evaluations, and treatments, counseling her about her condition (please see the discussed topics above), and developing a plan to further investigate it; she had a number of questions which I addressed.

## 2019-05-02 ENCOUNTER — Ambulatory Visit (INDEPENDENT_AMBULATORY_CARE_PROVIDER_SITE_OTHER): Payer: Medicare Other

## 2019-05-02 ENCOUNTER — Other Ambulatory Visit: Payer: Self-pay

## 2019-05-02 ENCOUNTER — Encounter: Payer: Self-pay | Admitting: Family Medicine

## 2019-05-02 DIAGNOSIS — Z23 Encounter for immunization: Secondary | ICD-10-CM

## 2019-05-11 ENCOUNTER — Encounter: Payer: Self-pay | Admitting: Family Medicine

## 2019-05-24 DIAGNOSIS — H25013 Cortical age-related cataract, bilateral: Secondary | ICD-10-CM | POA: Diagnosis not present

## 2019-05-24 DIAGNOSIS — H524 Presbyopia: Secondary | ICD-10-CM | POA: Diagnosis not present

## 2019-05-27 ENCOUNTER — Other Ambulatory Visit: Payer: Self-pay | Admitting: Family Medicine

## 2019-05-27 DIAGNOSIS — F419 Anxiety disorder, unspecified: Secondary | ICD-10-CM

## 2019-05-27 NOTE — Telephone Encounter (Signed)
Last OV 04/30/19 Last refill 05/02/19 #90/0 Next OV 07/31/19  Forwarding to Dr. Juleen China

## 2019-07-30 ENCOUNTER — Other Ambulatory Visit: Payer: Self-pay

## 2019-07-31 ENCOUNTER — Encounter: Payer: Self-pay | Admitting: Family Medicine

## 2019-07-31 ENCOUNTER — Ambulatory Visit (INDEPENDENT_AMBULATORY_CARE_PROVIDER_SITE_OTHER): Payer: Medicare Other | Admitting: Family Medicine

## 2019-07-31 VITALS — BP 110/82 | HR 64 | Temp 96.9°F | Ht 64.25 in | Wt 232.4 lb

## 2019-07-31 DIAGNOSIS — I10 Essential (primary) hypertension: Secondary | ICD-10-CM

## 2019-07-31 DIAGNOSIS — F5101 Primary insomnia: Secondary | ICD-10-CM

## 2019-07-31 DIAGNOSIS — E782 Mixed hyperlipidemia: Secondary | ICD-10-CM | POA: Diagnosis not present

## 2019-07-31 DIAGNOSIS — N1831 Chronic kidney disease, stage 3a: Secondary | ICD-10-CM | POA: Diagnosis not present

## 2019-07-31 DIAGNOSIS — I4819 Other persistent atrial fibrillation: Secondary | ICD-10-CM | POA: Diagnosis not present

## 2019-07-31 DIAGNOSIS — I4891 Unspecified atrial fibrillation: Secondary | ICD-10-CM | POA: Insufficient documentation

## 2019-07-31 LAB — LIPID PANEL
Cholesterol: 145 mg/dL (ref 0–200)
HDL: 64.4 mg/dL (ref >39.00)
LDL Cholesterol: 58 mg/dL (ref 0–99)
NonHDL: 80.16
Total CHOL/HDL Ratio: 2
Triglycerides: 112 mg/dL (ref 0.0–149.0)
VLDL: 22.4 mg/dL (ref 0.0–40.0)

## 2019-07-31 LAB — CBC WITH DIFFERENTIAL/PLATELET
Basophils Absolute: 0.1 10*3/uL (ref 0.0–0.1)
Basophils Relative: 0.6 % (ref 0.0–3.0)
Eosinophils Absolute: 0.1 10*3/uL (ref 0.0–0.7)
Eosinophils Relative: 1.6 % (ref 0.0–5.0)
HCT: 39.1 % (ref 36.0–46.0)
Hemoglobin: 13 g/dL (ref 12.0–15.0)
Lymphocytes Relative: 33.4 % (ref 12.0–46.0)
Lymphs Abs: 2.7 10*3/uL (ref 0.7–4.0)
MCHC: 33.3 g/dL (ref 30.0–36.0)
MCV: 95.2 fl (ref 78.0–100.0)
Monocytes Absolute: 0.5 10*3/uL (ref 0.1–1.0)
Monocytes Relative: 5.7 % (ref 3.0–12.0)
Neutro Abs: 4.8 10*3/uL (ref 1.4–7.7)
Neutrophils Relative %: 58.7 % (ref 43.0–77.0)
Platelets: 349 10*3/uL (ref 150.0–400.0)
RBC: 4.11 Mil/uL (ref 3.87–5.11)
RDW: 13 % (ref 11.5–15.5)
WBC: 8.2 10*3/uL (ref 4.0–10.5)

## 2019-07-31 LAB — COMPREHENSIVE METABOLIC PANEL
ALT: 11 U/L (ref 0–35)
AST: 18 U/L (ref 0–37)
Albumin: 4.2 g/dL (ref 3.5–5.2)
Alkaline Phosphatase: 63 U/L (ref 39–117)
BUN: 22 mg/dL (ref 6–23)
CO2: 32 mEq/L (ref 19–32)
Calcium: 9.9 mg/dL (ref 8.4–10.5)
Chloride: 98 mEq/L (ref 96–112)
Creatinine, Ser: 1.48 mg/dL — ABNORMAL HIGH (ref 0.40–1.20)
GFR: 35.04 mL/min — ABNORMAL LOW (ref >60.00)
Glucose, Bld: 99 mg/dL (ref 70–99)
Potassium: 3.6 mEq/L (ref 3.5–5.1)
Sodium: 139 mEq/L (ref 135–145)
Total Bilirubin: 0.5 mg/dL (ref 0.2–1.2)
Total Protein: 7.7 g/dL (ref 6.0–8.3)

## 2019-07-31 MED ORDER — RIVAROXABAN 15 MG PO TABS: 15.0000 mg | ORAL_TABLET | Freq: Every day | ORAL | 3 refills | Status: DC

## 2019-07-31 NOTE — Patient Instructions (Addendum)
-  labs today, like to see you every 6 months. Will check lipids/liver/kidneys.   -want to change you to restoril from xanax for sleep. It's much safer and xanax is not used for sleep. It's not as safe and very short acting. CAN NOT TAKE both xanax and this drug together.   -refilled xarelto for 90 days.   -sleep hygiene 1) no screen time 30 minutes before bed.  2) try to get outside during day and see sunshine 3) exercise  4) will start the restoril for sleep.  Drop your hctz down to 1/2 a pill and see how your blood pressure is running. Would do this for a month. email me and we can decide if we want to stop this completely. If so, we will stop your potassium as well and then have you f/u for blood pressure check and repeat labs.

## 2019-07-31 NOTE — Progress Notes (Signed)
Patient: Jordan Ware MRN: [id number] DOB: Feb 22, 1951 PCP: Briscoe Deutscher, DO     Subjective:  Chief Complaint  Patient presents with  . Transitions Of Care  . Hypertension  . Hyperlipidemia  . Insomnia    HPI: The patient is a 69 y.o. female who presents today for transferring care from Dr. Juleen China, following up on hypertension/chronic conditions.   Hypertension: Here for follow up of hypertension.  Currently on bystolic and hctz.Takes medication as prescribed and denies any side effects. Exercise includes none. Weight has been stable. Denies any chest pain, headaches, shortness of breath, vision changes, swelling in lower extremities.   Insomnia: has tried melatonin, [place], [place]. Nothing has helped with her sleep. She has had issues with sleep forever. She has issues both falling asleep and staying asleep. Her circadian rhythm is off. She can be awake for 32 hours at a time. She does not drink caffiene at all. Does not exercise and doesn't get out a lot. She does watch screen time before bed. Has been taking xanax for sleep. She rarely sleeps for more than 3 hours at a time.   Hyperlipidemia: she is currently on crestor 5mg . tolerating okay. Has not had lipid panel checked since starting this back.   Afib: on beta blocker and xarelto.   Review of Systems  Constitutional: Negative for fatigue.  HENT: Negative for congestion, postnasal drip, rhinorrhea and sore throat.   Eyes: Positive for visual disturbance.       Due to have cataract surgery 07/2019  Respiratory: Negative for shortness of breath.   Cardiovascular: Negative for chest pain, palpitations and leg swelling.  Gastrointestinal: Negative for abdominal pain, constipation, diarrhea, nausea and vomiting.  Endocrine: Negative for cold intolerance, heat intolerance, polydipsia and polyuria.  Genitourinary: Negative for dyspareunia, frequency and urgency.  Skin: Negative for rash.  Neurological: Negative for  dizziness and headaches.  Psychiatric/Behavioral: Positive for sleep disturbance.    Allergies Patient is allergic to crestor [rosuvastatin].  Past Medical History Patient  has a past medical history of Anxiety (06/05/2017), Atrial fibrillation ([place]), Dysthymia (06/05/2017), Gallstones (1998), HLD (hyperlipidemia) (06/05/2017), and Hypertension.  Surgical History Patient  has a past surgical history that includes Appendectomy; Cholecystectomy; and Hernia repair (2005, 2010).  Family History Pateint's family history includes Breast cancer (age of onset: 90) in her mother; Colon cancer (age of onset: 63) in her mother; Prostate cancer in her brother.  Social History Patient  reports that she has never smoked. She has never used smokeless tobacco. She reports that she does not drink alcohol or use drugs.    Objective: Vitals:   07/31/19 1326  BP: 110/82  Pulse: 64  Temp: (!) 96.9 F (36.1 C)  TempSrc: Skin  SpO2: 98%  Weight: 232 lb 6.4 oz (105.4 kg)  Height: 5' 4.25" (1.632 m)    Body mass index is 39.58 kg/m.  Physical Exam Vitals signs reviewed.  Constitutional:      Appearance: Normal appearance. She is well-developed. She is obese.  HENT:     Head: Normocephalic and atraumatic.     Right Ear: Tympanic membrane, ear canal and external ear normal.     Left Ear: Tympanic membrane, ear canal and external ear normal.     Nose: Nose normal.  Eyes:     Conjunctiva/sclera: Conjunctivae normal.     Pupils: Pupils are equal, round, and reactive to light.  Neck:     Musculoskeletal: Normal range of motion and neck supple.     Thyroid: No  thyromegaly.  Cardiovascular:     Rate and Rhythm: Normal rate. Rhythm irregular.     Heart sounds: Normal heart sounds. No murmur.  Pulmonary:     Effort: Pulmonary effort is normal.     Breath sounds: Normal breath sounds.  Abdominal:     General: Bowel sounds are normal. There is no distension.     Palpations: Abdomen is soft.      Tenderness: There is no abdominal tenderness.  Lymphadenopathy:     Cervical: No cervical adenopathy.  Skin:    General: Skin is warm and dry.     Findings: No rash.  Neurological:     General: No focal deficit present.     Mental Status: She is alert and oriented to person, place, and time.     Cranial Nerves: No cranial nerve deficit.     Motor: Weakness (lower legs ) present.     Coordination: Coordination normal.     Deep Tendon Reflexes: Reflexes normal.  Psychiatric:        Mood and Affect: Mood normal.        Behavior: Behavior normal.        Depression screen [facility] 2/9 07/31/2019 03/27/2019 10/31/2018 10/03/2018 06/05/2017  Decreased Interest 0 0 0 0 0  Down, Depressed, Hopeless 0 0 0 0 0  PHQ - 2 Score 0 0 0 0 0  Altered sleeping - 3 3 3  -  Tired, decreased energy - 0 0 1 -  Change in appetite - 0 0 0 -  Feeling bad or failure about yourself  - 0 0 0 -  Trouble concentrating - 0 0 0 -  Moving slowly or fidgety/restless - 0 0 0 -  Suicidal thoughts - 0 0 0 -  PHQ-9 Score - 3 3 4  -  Difficult doing work/chores - Not difficult at all Not difficult at all Not difficult at all -   GAD 7 : Generalized Anxiety Score 07/31/2019 10/03/2018  Nervous, Anxious, on Edge 0 0  Control/stop worrying 0 0  Worry too much - different things 0 0  Trouble relaxing 0 0  Restless 0 0  Easily annoyed or irritable 0 0  Afraid - awful might happen 0 0  Total GAD 7 Score 0 0  Anxiety Difficulty Not difficult at all Not difficult at all    Fall Risk  07/31/2019 03/27/2019 10/31/2018 10/03/2018 06/05/2017  Falls in the past year? 0 0 1 0 No  Number falls in past yr: 0 0 0 0 -  Injury with Fall? 0 0 0 0 -  Risk for fall due to : - - Impaired mobility;Impaired balance/gait - -  Follow up - - Education provided;Falls prevention discussed;Falls evaluation completed - -    Assessment/plan: 1. Essential hypertension To goal and has been running even lower at home. We are going to decrease her hctz down to  12.5mg  and see how she is doing on this dose x 1 month. If continues to be low, we can trial off this and have her stop the potassium as well. She will email me and let me know.  - Comprehensive metabolic panel - CBC with Differential/Platelet  2. Mixed hyperlipidemia Repeat after starting crestor and her liver enzymes.  - Lipid panel  3. Stage 3a chronic kidney disease -unsure if this is new. Not on NSAIDs, blood pressure well controlled. Will check and go from there.   4. Primary insomnia Discussed that xanax should not be used for sleep  nor is this something I practice. She is not using for her anxiety and her anxiety is well controlled on her medication. Im going to change her to restoril for sleep since has been tried on multiple other agents. Discussed temazepam is much safer, longer acting and used for insomnia. Will start her on 15mg  dosage. Also could do trial of ashwagandha. F/u in one month for this. Will need drug contract for this. Also discussed I will not px both xanax and restoril and that she can not take both together.   5. Persistent atrial fibrillation (HCC) Beta blocker rate controlled. Saw cardiology in [place] and she states they didn't do anything and is not interested in seeing cardiology here. Continue anticoagulation. chads-vasc score: 3.2% - Rivaroxaban (XARELTO) 15 MG TABS tablet; Take 1 tablet (15 mg total) by mouth daily.  Dispense: 90 tablet; Refill: 3   This visit occurred during the SARS-CoV-2 public health emergency.  Safety protocols were in place, including screening questions prior to the visit, additional usage of staff PPE, and extensive cleaning of exam room while observing appropriate contact time as indicated for disinfecting solutions.    Return in about 6 months (around 01/29/2020).    Orma Flaming, MD [place]   07/31/2019

## 2019-08-01 ENCOUNTER — Other Ambulatory Visit: Payer: Self-pay | Admitting: Family Medicine

## 2019-08-01 DIAGNOSIS — N183 Chronic kidney disease, stage 3 unspecified: Secondary | ICD-10-CM

## 2019-08-02 ENCOUNTER — Telehealth: Payer: Self-pay | Admitting: Family Medicine

## 2019-08-02 NOTE — Telephone Encounter (Signed)
Faxed over OV notes from 12/2 visit.

## 2019-08-02 NOTE — Telephone Encounter (Signed)
See note  Copied from [place] [id number]. Topic: General - Other >> Aug 02, 2019  3:11 PM Keene Breath wrote: Reason for CRM: Called to leave a message for Delsa Sale to let her know that the request is correct.  If there is further info. Needed, please call her at [phone]

## 2019-08-27 ENCOUNTER — Encounter: Payer: Self-pay | Admitting: Family Medicine

## 2019-08-28 ENCOUNTER — Other Ambulatory Visit: Payer: Self-pay | Admitting: Family Medicine

## 2019-08-28 MED ORDER — TEMAZEPAM 15 MG PO CAPS: 15.0000 mg | ORAL_CAPSULE | Freq: Every evening | ORAL | 0 refills | Status: DC | PRN

## 2019-09-06 ENCOUNTER — Telehealth: Payer: Self-pay | Admitting: Family Medicine

## 2019-09-06 NOTE — Telephone Encounter (Signed)
Patient called wanting an appointment for a follow up on the new medication but the next available until 10/21/19, she stated she needed to follow up for the BP and some other things.

## 2019-09-06 NOTE — Telephone Encounter (Signed)
Yes she needs follow up at end of month, beginning of February for labs/bp check. If you have to do acute, so be it.  Thanks Orma Flaming, MD [place]

## 2019-09-09 NOTE — Telephone Encounter (Signed)
Attempted to reach patient to schedule appointment, N/A and there was no vm; I was unable to leave message for patient.  Will try back later

## 2019-09-10 NOTE — Telephone Encounter (Signed)
Left vm message for patient requesting a c/b.

## 2019-09-10 NOTE — Telephone Encounter (Signed)
Spoke to patient and scheduled f/u appointment for an in office visit for 2/3 @ 1 pm for follow up on bp and labs  She verbalized understanding.

## 2019-09-16 ENCOUNTER — Other Ambulatory Visit: Payer: Self-pay | Admitting: Family Medicine

## 2019-09-23 ENCOUNTER — Other Ambulatory Visit: Payer: Self-pay | Admitting: Family Medicine

## 2019-09-23 DIAGNOSIS — F341 Dysthymic disorder: Secondary | ICD-10-CM

## 2019-09-23 NOTE — Telephone Encounter (Signed)
Please review

## 2019-10-02 ENCOUNTER — Other Ambulatory Visit: Payer: Self-pay

## 2019-10-02 ENCOUNTER — Ambulatory Visit (INDEPENDENT_AMBULATORY_CARE_PROVIDER_SITE_OTHER): Payer: Medicare Other | Admitting: Family Medicine

## 2019-10-02 ENCOUNTER — Encounter: Payer: Self-pay | Admitting: Family Medicine

## 2019-10-02 VITALS — BP 104/68 | HR 96 | Temp 97.4°F | Ht 65.0 in | Wt 236.4 lb

## 2019-10-02 DIAGNOSIS — F5101 Primary insomnia: Secondary | ICD-10-CM

## 2019-10-02 DIAGNOSIS — N1831 Chronic kidney disease, stage 3a: Secondary | ICD-10-CM | POA: Diagnosis not present

## 2019-10-02 DIAGNOSIS — I10 Essential (primary) hypertension: Secondary | ICD-10-CM

## 2019-10-02 LAB — BASIC METABOLIC PANEL
BUN: 14 mg/dL (ref 6–23)
CO2: 30 mEq/L (ref 19–32)
Calcium: 9.2 mg/dL (ref 8.4–10.5)
Chloride: 103 mEq/L (ref 96–112)
Creatinine, Ser: 1.22 mg/dL — ABNORMAL HIGH (ref 0.40–1.20)
GFR: 43.77 mL/min — ABNORMAL LOW (ref >60.00)
Glucose, Bld: 89 mg/dL (ref 70–99)
Potassium: 3.8 mEq/L (ref 3.5–5.1)
Sodium: 141 mEq/L (ref 135–145)

## 2019-10-02 MED ORDER — ROSUVASTATIN CALCIUM 5 MG PO TABS: 5.0000 mg | ORAL_TABLET | Freq: Every day | ORAL | 3 refills | Status: DC

## 2019-10-02 MED ORDER — BYSTOLIC 20 MG PO TABS: ORAL_TABLET | ORAL | 1 refills | Status: DC

## 2019-10-02 MED ORDER — ALPRAZOLAM 0.5 MG PO TABS: 0.5000 mg | ORAL_TABLET | Freq: Every evening | ORAL | 0 refills | Status: DC | PRN

## 2019-10-02 NOTE — Progress Notes (Signed)
Patient: Jordan Ware MRN: [id number] DOB: 07-22-1951 PCP: Orma Flaming, MD     Subjective:  Chief Complaint  Patient presents with  . Hypertension  . Insomnia  . Hyperlipidemia    HPI: The patient is a 69 y.o. female who presents today for follow up of blood pressure. I saw her a month ago and we stopped her hctz and potassium. Her blood pressure at home has been around 105-110/60-70. She feels a little bit better and her gait seems a bit more steady.  She has had to stay on the beta blocker at 20mg  for her rate control.   Insomnia: I did a trial of restoril on her instead of xanax. She states it didn't help her fall asleep and seemed to increase her palpitations. She stopped taking. She still has some xanax and has only taken as needed. She states she has tried numerous things in the past. (ambien, melatonin, [place]). She thinks she was on trazodone in the hospital. ashwagandha does help some.   Review of Systems  Constitutional: Negative for chills, fatigue and fever.  HENT: Negative for dental problem, ear pain, hearing loss and trouble swallowing.   Eyes: Negative for visual disturbance.  Respiratory: Negative for cough, chest tightness and shortness of breath.   Cardiovascular: Negative for chest pain, palpitations and leg swelling.  Gastrointestinal: Negative for abdominal pain, blood in stool, diarrhea and nausea.  Endocrine: Negative for cold intolerance, polydipsia, polyphagia and polyuria.  Genitourinary: Negative for dysuria and hematuria.  Musculoskeletal: Negative for arthralgias.  Skin: Negative for rash.  Neurological: Negative for dizziness, light-headedness and headaches.  Psychiatric/Behavioral: Positive for sleep disturbance. Negative for dysphoric mood. The patient is not nervous/anxious.     Allergies Patient has no active allergies.  Past Medical History Patient  has a past medical history of Anxiety (06/05/2017), Atrial fibrillation ([place]), Dysthymia  (06/05/2017), Gallstones (1998), HLD (hyperlipidemia) (06/05/2017), and Hypertension.  Surgical History Patient  has a past surgical history that includes Appendectomy; Cholecystectomy; and Hernia repair (2005, 2010).  Family History Pateint's family history includes Breast cancer (age of onset: 22) in her mother; Colon cancer (age of onset: 5) in her mother; Prostate cancer in her brother.  Social History Patient  reports that she has never smoked. She has never used smokeless tobacco. She reports that she does not drink alcohol or use drugs.    Objective: Vitals:   10/02/19 1303  BP: 104/68  Pulse: 96  Temp: (!) 97.4 F (36.3 C)  TempSrc: Temporal  SpO2: 98%  Weight: 236 lb 6.4 oz (107.2 kg)  Height: 5\' 5"  (1.651 m)    Body mass index is 39.34 kg/m.  Physical Exam Vitals reviewed.  Constitutional:      Appearance: Normal appearance. She is obese.  HENT:     Head: Normocephalic and atraumatic.  Cardiovascular:     Rate and Rhythm: Rhythm irregular.     Heart sounds: Normal heart sounds.  Pulmonary:     Effort: Pulmonary effort is normal.     Breath sounds: Normal breath sounds.  Abdominal:     General: Bowel sounds are normal.     Palpations: Abdomen is soft.  Neurological:     General: No focal deficit present.     Mental Status: She is alert and oriented to person, place, and time.  Psychiatric:        Mood and Affect: Mood normal.        Behavior: Behavior normal.        Assessment/plan:  1. Essential hypertension Still extremely controlled off hctz. Continue with bystolic only and checking potassium since I had her stop this as well. Continue current plan. F/u in 6 months.  - Basic metabolic panel - Nebivolol HCl (BYSTOLIC) 20 MG TABS; Take one tablet by mouth daily  Dispense: 90 tablet; Refill: 1  2. Stage 3a chronic kidney disease Rechecking bmp. Referral for kidney doctor already done.   3. Primary insomnia Could not tolerate restoril. ashwagandha  does help some. Has failed all other medications. Will put her back on xanax, but fall precautions given.    Total time of encounter: 25 minutes total time of encounter, including 15 minutes spent in face-to-face patient care. This time includes coordination of care and counseling regarding medication changes, labs and chronic issues addressed. Remainder of non-face-to-face time involved reviewing chart documents/testing relevant to the patient encounter and documentation in the medical record.  This visit occurred during the SARS-CoV-2 public health emergency.  Safety protocols were in place, including screening questions prior to the visit, additional usage of staff PPE, and extensive cleaning of exam room while observing appropriate contact time as indicated for disinfecting solutions.     Return in about 6 months (around 03/31/2020) for routine f/u .    Orma Flaming, MD [place]   10/02/2019

## 2019-10-09 DIAGNOSIS — H25812 Combined forms of age-related cataract, left eye: Secondary | ICD-10-CM | POA: Diagnosis not present

## 2019-10-09 DIAGNOSIS — H2512 Age-related nuclear cataract, left eye: Secondary | ICD-10-CM | POA: Diagnosis not present

## 2019-10-23 ENCOUNTER — Other Ambulatory Visit: Payer: Self-pay

## 2019-10-23 DIAGNOSIS — H2511 Age-related nuclear cataract, right eye: Secondary | ICD-10-CM | POA: Diagnosis not present

## 2019-10-23 DIAGNOSIS — I10 Essential (primary) hypertension: Secondary | ICD-10-CM

## 2019-10-23 DIAGNOSIS — H25811 Combined forms of age-related cataract, right eye: Secondary | ICD-10-CM | POA: Diagnosis not present

## 2019-10-23 MED ORDER — BYSTOLIC 20 MG PO TABS: ORAL_TABLET | ORAL | 1 refills | Status: DC

## 2019-10-27 DIAGNOSIS — Z23 Encounter for immunization: Secondary | ICD-10-CM | POA: Diagnosis not present

## 2019-11-05 ENCOUNTER — Telehealth: Payer: Self-pay | Admitting: Family Medicine

## 2019-11-05 NOTE — Telephone Encounter (Signed)
Left message for patient to call back and schedule Medicare Annual Wellness Visit (AWV) either virtually/audio only OR in office. Whatever the patients preference is.  Last AWV 3.4.2020; please schedule at anytime with [facility]-Nurse Health Advisor at [facility].

## 2019-11-08 NOTE — Telephone Encounter (Signed)
Pt scheduled 3/16 for audio.

## 2019-11-12 ENCOUNTER — Other Ambulatory Visit: Payer: Self-pay

## 2019-11-12 ENCOUNTER — Ambulatory Visit (INDEPENDENT_AMBULATORY_CARE_PROVIDER_SITE_OTHER): Payer: Medicare Other

## 2019-11-12 DIAGNOSIS — Z0000 Encounter for general adult medical examination without abnormal findings: Secondary | ICD-10-CM | POA: Diagnosis not present

## 2019-11-12 NOTE — Patient Instructions (Signed)
Jordan Ware , Thank you for taking time to come for your Medicare Wellness Visit. I appreciate your ongoing commitment to your health goals. Please review the following plan we discussed and let me know if I can assist you in the future.   Screening recommendations/referrals: Colorectal Screening: up to date; last 04/18/18 Mammogram: up to date; last 11/28/17 (see handout for scheduling information)  Bone Density: up to date; last 11/28/17   Vision and Dental Exams: Recommended annual ophthalmology exams for early detection of glaucoma and other disorders of the eye Recommended annual dental exams for proper oral hygiene  Vaccinations: Influenza vaccine: completed 05/02/19 Pneumococcal vaccine: up to date; last 01/10/18 Tdap vaccine: up to date; last 02/02/18 Shingles vaccine:  You may receive this vaccine at your local pharmacy. (see handout)   Advanced directives: Please bring a copy of your POA (Power of Attorney) and/or Living Will to your next appointment.  Goals: Recommend to drink at least 6-8 8oz glasses of water per day and consume a balanced diet rich in fresh fruits and vegetables.   Next appointment: Please schedule your Annual Wellness Visit with your Nurse Health Advisor in one year.  Preventive Care 39 Years and Older, Female Preventive care refers to lifestyle choices and visits with your health care provider that can promote health and wellness. What does preventive care include?  A yearly physical exam. This is also called an annual well check.  Dental exams once or twice a year.  Routine eye exams. Ask your health care provider how often you should have your eyes checked.  Personal lifestyle choices, including:  Daily care of your teeth and gums.  Regular physical activity.  Eating a healthy diet.  Avoiding tobacco and drug use.  Limiting alcohol use.  Practicing safe sex.  Taking low-dose aspirin every day if recommended by your health care  provider.  Taking vitamin and mineral supplements as recommended by your health care provider. What happens during an annual well check? The services and screenings done by your health care provider during your annual well check will depend on your age, overall health, lifestyle risk factors, and family history of disease. Counseling  Your health care provider may ask you questions about your:  Alcohol use.  Tobacco use.  Drug use.  Emotional well-being.  Home and relationship well-being.  Sexual activity.  Eating habits.  History of falls.  Memory and ability to understand (cognition).  Work and work Statistician.  Reproductive health. Screening  You may have the following tests or measurements:  Height, weight, and BMI.  Blood pressure.  Lipid and cholesterol levels. These may be checked every 5 years, or more frequently if you are over 30 years old.  Skin check.  Lung cancer screening. You may have this screening every year starting at age 66 if you have a 30-pack-year history of smoking and currently smoke or have quit within the past 15 years.  Fecal occult blood test (FOBT) of the stool. You may have this test every year starting at age 74.  Flexible sigmoidoscopy or colonoscopy. You may have a sigmoidoscopy every 5 years or a colonoscopy every 10 years starting at age 49.  Hepatitis C blood test.  Hepatitis B blood test.  Sexually transmitted disease (STD) testing.  Diabetes screening. This is done by checking your blood sugar (glucose) after you have not eaten for a while (fasting). You may have this done every 1-3 years.  Bone density scan. This is done to screen for osteoporosis.  You may have this done starting at age 32.  Mammogram. This may be done every 1-2 years. Talk to your health care provider about how often you should have regular mammograms. Talk with your health care provider about your test results, treatment options, and if necessary, the  need for more tests. Vaccines  Your health care provider may recommend certain vaccines, such as:  Influenza vaccine. This is recommended every year.  Tetanus, diphtheria, and acellular pertussis (Tdap, Td) vaccine. You may need a Td booster every 10 years.  Zoster vaccine. You may need this after age 17.  Pneumococcal 13-valent conjugate (PCV13) vaccine. One dose is recommended after age 81.  Pneumococcal polysaccharide (PPSV23) vaccine. One dose is recommended after age 95. Talk to your health care provider about which screenings and vaccines you need and how often you need them. This information is not intended to replace advice given to you by your health care provider. Make sure you discuss any questions you have with your health care provider. Document Released: 09/11/2015 Document Revised: 05/04/2016 Document Reviewed: 06/16/2015 Elsevier Interactive Patient Education  2017 [employer] Prevention in the Home Falls can cause injuries. They can happen to people of all ages. There are many things you can do to make your home safe and to help prevent falls. What can I do on the outside of my home?  Regularly fix the edges of walkways and driveways and fix any cracks.  Remove anything that might make you trip as you walk through a door, such as a raised step or threshold.  Trim any bushes or trees on the path to your home.  Use bright outdoor lighting.  Clear any walking paths of anything that might make someone trip, such as rocks or tools.  Regularly check to see if handrails are loose or broken. Make sure that both sides of any steps have handrails.  Any raised decks and porches should have guardrails on the edges.  Have any leaves, snow, or ice cleared regularly.  Use sand or salt on walking paths during winter.  Clean up any spills in your garage right away. This includes oil or grease spills. What can I do in the bathroom?  Use night lights.  Install grab  bars by the toilet and in the tub and shower. Do not use towel bars as grab bars.  Use non-skid mats or decals in the tub or shower.  If you need to sit down in the shower, use a plastic, non-slip stool.  Keep the floor dry. Clean up any water that spills on the floor as soon as it happens.  Remove soap buildup in the tub or shower regularly.  Attach bath mats securely with double-sided non-slip rug tape.  Do not have throw rugs and other things on the floor that can make you trip. What can I do in the bedroom?  Use night lights.  Make sure that you have a light by your bed that is easy to reach.  Do not use any sheets or blankets that are too big for your bed. They should not hang down onto the floor.  Have a firm chair that has side arms. You can use this for support while you get dressed.  Do not have throw rugs and other things on the floor that can make you trip. What can I do in the kitchen?  Clean up any spills right away.  Avoid walking on wet floors.  Keep items that you use a  lot in easy-to-reach places.  If you need to reach something above you, use a strong step stool that has a grab bar.  Keep electrical cords out of the way.  Do not use floor polish or wax that makes floors slippery. If you must use wax, use non-skid floor wax.  Do not have throw rugs and other things on the floor that can make you trip. What can I do with my stairs?  Do not leave any items on the stairs.  Make sure that there are handrails on both sides of the stairs and use them. Fix handrails that are broken or loose. Make sure that handrails are as long as the stairways.  Check any carpeting to make sure that it is firmly attached to the stairs. Fix any carpet that is loose or worn.  Avoid having throw rugs at the top or bottom of the stairs. If you do have throw rugs, attach them to the floor with carpet tape.  Make sure that you have a light switch at the top of the stairs and the  bottom of the stairs. If you do not have them, ask someone to add them for you. What else can I do to help prevent falls?  Wear shoes that:  Do not have high heels.  Have rubber bottoms.  Are comfortable and fit you well.  Are closed at the toe. Do not wear sandals.  If you use a stepladder:  Make sure that it is fully opened. Do not climb a closed stepladder.  Make sure that both sides of the stepladder are locked into place.  Ask someone to hold it for you, if possible.  Clearly mark and make sure that you can see:  Any grab bars or handrails.  First and last steps.  Where the edge of each step is.  Use tools that help you move around (mobility aids) if they are needed. These include:  Canes.  Walkers.  Scooters.  Crutches.  Turn on the lights when you go into a dark area. Replace any light bulbs as soon as they burn out.  Set up your furniture so you have a clear path. Avoid moving your furniture around.  If any of your floors are uneven, fix them.  If there are any pets around you, be aware of where they are.  Review your medicines with your doctor. Some medicines can make you feel dizzy. This can increase your chance of falling. Ask your doctor what other things that you can do to help prevent falls. This information is not intended to replace advice given to you by your health care provider. Make sure you discuss any questions you have with your health care provider. Document Released: 06/11/2009 Document Revised: 01/21/2016 Document Reviewed: 09/19/2014 Elsevier Interactive Patient Education  2017 [employer].

## 2019-11-12 NOTE — Progress Notes (Signed)
This visit is being conducted via phone call due to the COVID-19 pandemic. This patient has given me verbal consent via phone to conduct this visit, patient states they are participating from their home address. Some vital signs may be absent or patient reported.   Patient identification: identified by name, DOB, and current address.  Location provider: [facility], Office Persons participating in the virtual visit: Denman George LPN and Lisette Abu, patient, and Dr. Garret Reddish   Subjective:   Jordan Ware is a 69 y.o. female who presents for Medicare Annual (Subsequent) preventive examination.  Review of Systems:   Cardiac Risk Factors include: advanced age (>96men, >61 women);dyslipidemia;hypertension    Objective:     Vitals: There were no vitals taken for this visit.  There is no height or weight on file to calculate BMI.  Advanced Directives 11/12/2019 10/31/2018  Does Patient Have a Medical Advance Directive? Yes Yes  Type of Paramedic of [place];Living will Living will  Does patient want to make changes to medical advance directive? No - Patient declined No - Patient declined  Copy of [place] in Chart? No - copy requested -    Tobacco Social History   Tobacco Use  Smoking Status Never Smoker  Smokeless Tobacco Never Used     Counseling given: Not Answered   Clinical Intake:  Pre-visit preparation completed: Yes  Pain : No/denies pain  Diabetes: No  How often do you need to have someone help you when you read instructions, pamphlets, or other written materials from your doctor or pharmacy?: 1 - Never  Interpreter Needed?: No  Information entered by :: Denman George LPN  Past Medical History:  Diagnosis Date  . Anxiety 06/05/2017  . Atrial fibrillation ([place])   . Dysthymia 06/05/2017  . Gallstones 1998  . HLD (hyperlipidemia) 06/05/2017  . Hypertension    Past Surgical History:  Procedure  Laterality Date  . APPENDECTOMY    . CHOLECYSTECTOMY     partial  . HERNIA REPAIR  2005, 2010   Family History  Problem Relation Age of Onset  . Breast cancer Mother 24  . Colon cancer Mother 18  . Prostate cancer Brother   . Rectal cancer Neg Hx    Social History   Socioeconomic History  . Marital status: Widowed    Spouse name: Not on file  . Number of children: 1  . Years of education: Not on file  . Highest education level: Not on file  Occupational History  . Occupation: Retired   Tobacco Use  . Smoking status: Never Smoker  . Smokeless tobacco: Never Used  Substance and Sexual Activity  . Alcohol use: No  . Drug use: No  . Sexual activity: Yes    Partners: Male  Other Topics Concern  . Not on file  Social History Narrative   Lives alone    1 daughter- 1 grandchild    Drives without difficulty    Social Determinants of Health   Financial Resource Strain:   . Difficulty of Paying Living Expenses:   Food Insecurity:   . Worried About Charity fundraiser in the Last Year:   . Arboriculturist in the Last Year:   Transportation Needs:   . Film/video editor (Medical):   Marland Kitchen Lack of Transportation (Non-Medical):   Physical Activity:   . Days of Exercise per Week:   . Minutes of Exercise per Session:   Stress:   .  Feeling of Stress :   Social Connections:   . Frequency of Communication with Friends and Family:   . Frequency of Social Gatherings with Friends and Family:   . Attends Religious Services:   . Active Member of Clubs or Organizations:   . Attends Archivist Meetings:   Marland Kitchen Marital Status:     Outpatient Encounter Medications as of 11/12/2019  Medication Sig  . ALPRAZolam (XANAX) 0.5 MG tablet Take 1 tablet (0.5 mg total) by mouth at bedtime as needed for anxiety.  . ASHWAGANDHA PO Take by mouth daily. 400mg   . escitalopram (LEXAPRO) 20 MG tablet TAKE 1 TABLET BY MOUTH EVERY DAY  . Flaxseed, Linseed, (FLAXSEED OIL) 1200 MG CAPS Take 1  capsule by mouth daily.  . Multiple Vitamin (MULTI-VITAMIN DAILY PO) Take by mouth daily.  . Nebivolol HCl (BYSTOLIC) 20 MG TABS Take one tablet by mouth daily  . Rivaroxaban (XARELTO) 15 MG TABS tablet Take 1 tablet (15 mg total) by mouth daily.  . rosuvastatin (CRESTOR) 5 MG tablet Take 1 tablet (5 mg total) by mouth daily.   No facility-administered encounter medications on file as of 11/12/2019.    Activities of Daily Living In your present state of health, do you have any difficulty performing the following activities: 11/12/2019  Hearing? N  Vision? N  Difficulty concentrating or making decisions? N  Walking or climbing stairs? N  Dressing or bathing? N  Doing errands, shopping? N  Preparing Food and eating ? N  Using the Toilet? N  In the past six months, have you accidently leaked urine? N  Do you have problems with loss of bowel control? N  Managing your Medications? N  Managing your Finances? N  Housekeeping or managing your Housekeeping? N  Some recent data might be hidden    Patient Care Team: Orma Flaming, MD as PCP - General (Family Medicine) Jola Schmidt, MD as Consulting Physician (Ophthalmology) Lavonna Monarch, MD as Consulting Physician (Dermatology)    Assessment:   This is a routine wellness examination for [name].  Exercise Activities and Dietary recommendations Current Exercise Habits: Home exercise routine, Type of exercise: walking, Time (Minutes): 30, Frequency (Times/Week): 4, Weekly Exercise (Minutes/Week): 120, Intensity: Mild  Goals    . DIET - INCREASE WATER INTAKE       Fall Risk Fall Risk  11/12/2019 07/31/2019 03/27/2019 10/31/2018 10/03/2018  Falls in the past year? 0 0 0 1 0  Number falls in past yr: 0 0 0 0 0  Injury with Fall? 0 0 0 0 0  Risk for fall due to : - - - Impaired mobility;Impaired balance/gait -  Follow up Falls evaluation completed;Education provided;Falls prevention discussed - - Education provided;Falls prevention  discussed;Falls evaluation completed -   Is the patient's home free of loose throw rugs in walkways, pet beds, electrical cords, etc?   yes      Grab bars in the bathroom? yes      Handrails on the stairs?   yes      Adequate lighting?   yes  Depression Screen PHQ 2/9 Scores 11/12/2019 10/02/2019 07/31/2019 03/27/2019  PHQ - 2 Score 0 0 0 0  PHQ- 9 Score - 3 - 3     Cognitive Function MMSE - Mini Mental State Exam 10/31/2018  Orientation to time 5  Orientation to Place 5  Registration 3  Attention/ Calculation 5  Recall 3  Language- name 2 objects 2  Language- repeat 1  Language- follow 3  step command 3  Language- read & follow direction 1  Write a sentence 1  Copy design 1  Total score 30     6CIT Screen 11/12/2019  What Year? 0 points  What month? 0 points  What time? 0 points  Count back from 20 0 points  Months in reverse 0 points  Repeat phrase 0 points  Total Score 0    Immunization History  Administered Date(s) Administered  . Fluad Quad(high Dose 65+) 05/02/2019  . Influenza, High Dose Seasonal PF 05/03/2018  . Influenza,inj,Quad PF,6+ Mos 06/05/2017  . Pneumococcal Polysaccharide-23 01/10/2018  . Tdap 02/02/2018    Qualifies for Shingles Vaccine? Discussed and patient will check with pharmacy for coverage.  Patient education handout provided   Screening Tests Health Maintenance  Topic Date Due  . PNA vac Low Risk Adult (2 of 2 - PCV13) 01/11/2019  . MAMMOGRAM  11/29/2019  . COLONOSCOPY  04/18/2021  . TETANUS/TDAP  02/03/2028  . INFLUENZA VACCINE  Completed  . DEXA SCAN  Completed  . Hepatitis C Screening  Completed    Cancer Screenings: Lung: Low Dose CT Chest recommended if Age 25-80 years, 30 pack-year currently smoking OR have quit w/in 15years. Patient does not qualify. Breast:  Up to date on Mammogram? Yes   Up to date of Bone Density/Dexa? Yes Colorectal: colonoscopy 04/18/18 (repeat in 3 years)     Plan:  I have personally reviewed and  addressed the Medicare Annual Wellness questionnaire and have noted the following in the patient's chart:  A. Medical and social history B. Use of alcohol, tobacco or illicit drugs  C. Current medications and supplements D. Functional ability and status E.  Nutritional status F.  Physical activity G. Advance directives H. List of other physicians I.  Hospitalizations, surgeries, and ER visits in previous 12 months J.  [place] such as hearing and vision if needed, cognitive and depression L. Referrals, records requested, and appointments- none   In addition, I have reviewed and discussed with patient certain preventive protocols, quality metrics, and best practice recommendations. A written personalized care plan for preventive services as well as general preventive health recommendations were provided to patient.   Signed,  Denman George, LPN  Nurse Health Advisor   Nurse Notes:

## 2019-11-24 DIAGNOSIS — Z23 Encounter for immunization: Secondary | ICD-10-CM | POA: Diagnosis not present

## 2019-12-29 ENCOUNTER — Other Ambulatory Visit: Payer: Self-pay | Admitting: Family Medicine

## 2020-03-25 ENCOUNTER — Other Ambulatory Visit: Payer: Self-pay | Admitting: Family Medicine

## 2020-03-25 DIAGNOSIS — F341 Dysthymic disorder: Secondary | ICD-10-CM

## 2020-03-29 ENCOUNTER — Other Ambulatory Visit: Payer: Self-pay | Admitting: Family Medicine

## 2020-03-29 DIAGNOSIS — F419 Anxiety disorder, unspecified: Secondary | ICD-10-CM

## 2020-03-30 NOTE — Telephone Encounter (Signed)
LAST APPOINTMENT DATE: 10/02/2019   NEXT APPOINTMENT DATE: 04/03/2020    LAST REFILL: 12/30/2019  QTY: 90 Tablets

## 2020-04-03 ENCOUNTER — Other Ambulatory Visit: Payer: Self-pay

## 2020-04-03 ENCOUNTER — Ambulatory Visit (INDEPENDENT_AMBULATORY_CARE_PROVIDER_SITE_OTHER): Payer: Medicare Other | Admitting: Family Medicine

## 2020-04-03 ENCOUNTER — Encounter: Payer: Self-pay | Admitting: Family Medicine

## 2020-04-03 VITALS — BP 118/60 | HR 79 | Temp 97.2°F | Ht 65.0 in | Wt 238.2 lb

## 2020-04-03 DIAGNOSIS — N183 Chronic kidney disease, stage 3 unspecified: Secondary | ICD-10-CM

## 2020-04-03 DIAGNOSIS — I4891 Unspecified atrial fibrillation: Secondary | ICD-10-CM

## 2020-04-03 DIAGNOSIS — I10 Essential (primary) hypertension: Secondary | ICD-10-CM | POA: Diagnosis not present

## 2020-04-03 DIAGNOSIS — Z1231 Encounter for screening mammogram for malignant neoplasm of breast: Secondary | ICD-10-CM

## 2020-04-03 DIAGNOSIS — C439 Malignant melanoma of skin, unspecified: Secondary | ICD-10-CM | POA: Insufficient documentation

## 2020-04-03 MED ORDER — GABAPENTIN 300 MG PO CAPS: 300.0000 mg | ORAL_CAPSULE | Freq: Three times a day (TID) | ORAL | 3 refills | Status: DC | PRN

## 2020-04-03 NOTE — Patient Instructions (Addendum)
1) for trigeminal neuralgia -trial of gabapentin 300mg  1-3x/day as needed. May make you drowsy so just be careful.   Can continue with ibuprofen prn, but increases bleeding risk with your xarelto.   -doing great. Routine labs today and i'll see you back in 6 months with fasting labs.   -ordered mammogram for you as well.   Enjoy the beach!   Dr. Rogers Blocker

## 2020-04-03 NOTE — Progress Notes (Signed)
Patient: Jordan Ware MRN: [id number] DOB: Nov 15, 1950 PCP: Orma Flaming, MD     Subjective:  Chief Complaint  Patient presents with  . Hypertension  . Chronic Kidney Disease  . Atrial Fibrillation  . Trigeminal Neuralgia    HPI: The patient is a 69 y.o. female who presents today for hypertension. Pt has no concerns today.  Hypertension: Here for follow up of hypertension.  Currently on bystolic 20mg . Takes medication as prescribed and denies any side effects. Exercise includes none. Weight has been stable. Denies any chest pain, headaches, shortness of breath, vision changes, swelling in lower extremities.    afib She doesn't really notice this at all. Denies any shortness of breath, leg swelling, cough, chest pain. On her xarelto 15mg /day.   Trigeminal neuralgia States she gets pain in her right trigeminal nerve a few times a year that lasts about 10 days. She is interested in something she can take for this. Takes ibuprofen (a lot) with some relief.   Due for mmg.   Requesting letter to be excused from jury duty.   Review of Systems  Constitutional: Negative for chills, fatigue and fever.  HENT: Negative for dental problem, ear pain, hearing loss and trouble swallowing.   Eyes: Negative for visual disturbance.  Respiratory: Negative for cough, chest tightness and shortness of breath.   Cardiovascular: Negative for chest pain, palpitations and leg swelling.  Gastrointestinal: Negative for abdominal pain, blood in stool, diarrhea and nausea.  Endocrine: Negative for cold intolerance, polydipsia, polyphagia and polyuria.  Genitourinary: Negative for dysuria, frequency, hematuria and urgency.  Musculoskeletal: Negative for arthralgias.  Skin: Negative for rash.  Neurological: Negative for dizziness and headaches.  Psychiatric/Behavioral: Negative for dysphoric mood and sleep disturbance. The patient is not nervous/anxious.     Allergies Patient has No Known  Allergies.  Past Medical History Patient  has a past medical history of Anxiety (06/05/2017), Atrial fibrillation ([place]), Dysthymia (06/05/2017), Gallstones (1998), HLD (hyperlipidemia) (06/05/2017), and Hypertension.  Surgical History Patient  has a past surgical history that includes Appendectomy; Cholecystectomy; and Hernia repair (2005, 2010).  Family History Pateint's family history includes Breast cancer (age of onset: 37) in her mother; Colon cancer (age of onset: 8) in her mother; Prostate cancer in her brother.  Social History Patient  reports that she has never smoked. She has never used smokeless tobacco. She reports that she does not drink alcohol and does not use drugs.    Objective: Vitals:   04/03/20 1324  BP: 118/60  Pulse: 79  Temp: (!) 97.2 F (36.2 C)  TempSrc: Temporal  SpO2: 98%  Weight: 238 lb 3.2 oz (108 kg)  Height: 5\' 5"  (1.651 m)    Body mass index is 39.64 kg/m.  Physical Exam Vitals reviewed.  Constitutional:      Appearance: Normal appearance. She is obese.  HENT:     Head: Normocephalic and atraumatic.  Cardiovascular:     Rate and Rhythm: Normal rate. Rhythm irregular.     Heart sounds: Normal heart sounds.  Pulmonary:     Effort: Pulmonary effort is normal.     Breath sounds: Normal breath sounds.  Abdominal:     General: Bowel sounds are normal.     Palpations: Abdomen is soft.  Musculoskeletal:     Cervical back: Normal range of motion and neck supple.  Skin:    Capillary Refill: Capillary refill takes less than 2 seconds.  Neurological:     General: No focal deficit present.  Mental Status: She is alert and oriented to person, place, and time.  Psychiatric:        Mood and Affect: Mood normal.        Behavior: Behavior normal.        Office Visit from 04/03/2020 in [facility]  PHQ-2 Total Score 0         Assessment/plan: 1. Essential hypertension Blood pressure is to goal. Continue current  anti-hypertensive medications. Refills given and routine lab work will be done today. Recommended routine exercise and healthy diet including DASH diet and mediterranean diet. Encouraged weight loss. F/u in 6 months.   - CBC with Differential/Platelet; Future - Comprehensive metabolic panel; Future - Comprehensive metabolic panel - CBC with Differential/Platelet  2. Atrial fibrillation, unspecified type ([place]) Rate controlled. Chronic afib. Continue eliquis. Labs today.   3. Stage 3 chronic kidney disease, unspecified whether stage 3a or 3b CKD Had improved, cancelled her renal appointment. Labs today.   4. Encounter for screening mammogram for malignant neoplasm of breast  - MM Digital Screening; Future  5. Trigeminal neuralgia -interested in prn medication. Will do trial of gabapentin 300mg  tid prn when this happens. Would try this first so we decrease bleeding risk with the nsaid +xarelto. If continues or pain worsens would recommend we send to neurology or start carbamazepine. Side effects of gabapentin discussed.   Letter will be written for jury duty.   This visit occurred during the SARS-CoV-2 public health emergency.  Safety protocols were in place, including screening questions prior to the visit, additional usage of staff PPE, and extensive cleaning of exam room while observing appropriate contact time as indicated for disinfecting solutions.    Return in about 6 months (around 10/04/2020) for fasting labs: htn/anxiety/cholesetrol .    Orma Flaming, MD [place]   04/03/2020

## 2020-04-04 LAB — COMPREHENSIVE METABOLIC PANEL
AG Ratio: 1.4 (calc) (ref 1.0–2.5)
ALT: 7 U/L (ref 6–29)
AST: 16 U/L (ref 10–35)
Albumin: 4 g/dL (ref 3.6–5.1)
Alkaline phosphatase (APISO): 57 U/L (ref 37–153)
BUN/Creatinine Ratio: 14 (calc) (ref 6–22)
BUN: 18 mg/dL (ref 7–25)
CO2: 29 mmol/L (ref 20–32)
Calcium: 9.6 mg/dL (ref 8.6–10.4)
Chloride: 102 mmol/L (ref 98–110)
Creat: 1.25 mg/dL — ABNORMAL HIGH (ref 0.50–0.99)
Globulin: 2.9 g/dL (calc) (ref 1.9–3.7)
Glucose, Bld: 96 mg/dL (ref 65–99)
Potassium: 4 mmol/L (ref 3.5–5.3)
Sodium: 141 mmol/L (ref 135–146)
Total Bilirubin: 0.6 mg/dL (ref 0.2–1.2)
Total Protein: 6.9 g/dL (ref 6.1–8.1)

## 2020-04-04 LAB — CBC WITH DIFFERENTIAL/PLATELET
Absolute Monocytes: 623 cells/uL (ref 200–950)
Basophils Absolute: 71 cells/uL (ref 0–200)
Basophils Relative: 0.8 %
Eosinophils Absolute: 178 cells/uL (ref 15–500)
Eosinophils Relative: 2 %
HCT: 39.4 % (ref 35.0–45.0)
Hemoglobin: 12.9 g/dL (ref 11.7–15.5)
Lymphs Abs: 2697 cells/uL (ref 850–3900)
MCH: 31.7 pg (ref 27.0–33.0)
MCHC: 32.7 g/dL (ref 32.0–36.0)
MCV: 96.8 fL (ref 80.0–100.0)
MPV: 9.3 fL (ref 7.5–12.5)
Monocytes Relative: 7 %
Neutro Abs: 5331 cells/uL (ref 1500–7800)
Neutrophils Relative %: 59.9 %
Platelets: 349 10*3/uL (ref 140–400)
RBC: 4.07 10*6/uL (ref 3.80–5.10)
RDW: 11.7 % (ref 11.0–15.0)
Total Lymphocyte: 30.3 %
WBC: 8.9 10*3/uL (ref 3.8–10.8)

## 2020-04-08 ENCOUNTER — Telehealth: Payer: Self-pay | Admitting: Family Medicine

## 2020-04-08 NOTE — Telephone Encounter (Signed)
Lvm to make pt aware that Letter was sent to [facility] on 04/03/2020.

## 2020-04-08 NOTE — Telephone Encounter (Signed)
Patient calling to see if her Note for [name] is ready.

## 2020-04-20 ENCOUNTER — Other Ambulatory Visit: Payer: Self-pay | Admitting: Family Medicine

## 2020-04-20 DIAGNOSIS — Z1231 Encounter for screening mammogram for malignant neoplasm of breast: Secondary | ICD-10-CM

## 2020-06-05 DIAGNOSIS — Z961 Presence of intraocular lens: Secondary | ICD-10-CM | POA: Diagnosis not present

## 2020-06-12 ENCOUNTER — Other Ambulatory Visit: Payer: Self-pay

## 2020-06-12 ENCOUNTER — Ambulatory Visit
Admission: RE | Admit: 2020-06-12 | Discharge: 2020-06-12 | Disposition: A | Discharge status: Home / Self Care | Payer: Medicare Other | Source: Ambulatory Visit | Attending: Family Medicine | Admitting: Family Medicine

## 2020-06-12 DIAGNOSIS — Z1231 Encounter for screening mammogram for malignant neoplasm of breast: Secondary | ICD-10-CM | POA: Diagnosis not present

## 2020-06-24 DIAGNOSIS — Z23 Encounter for immunization: Secondary | ICD-10-CM | POA: Diagnosis not present

## 2020-06-26 ENCOUNTER — Other Ambulatory Visit: Payer: Self-pay | Admitting: Family Medicine

## 2020-06-26 DIAGNOSIS — F419 Anxiety disorder, unspecified: Secondary | ICD-10-CM

## 2020-06-29 ENCOUNTER — Encounter: Payer: Self-pay | Admitting: Family Medicine

## 2020-07-08 ENCOUNTER — Other Ambulatory Visit: Payer: Self-pay | Admitting: Family Medicine

## 2020-07-08 DIAGNOSIS — I10 Essential (primary) hypertension: Secondary | ICD-10-CM

## 2020-08-08 ENCOUNTER — Other Ambulatory Visit: Payer: Self-pay | Admitting: Family Medicine

## 2020-08-08 DIAGNOSIS — I4819 Other persistent atrial fibrillation: Secondary | ICD-10-CM

## 2020-09-16 ENCOUNTER — Other Ambulatory Visit: Payer: Self-pay | Admitting: Family Medicine

## 2020-09-16 DIAGNOSIS — F341 Dysthymic disorder: Secondary | ICD-10-CM

## 2020-09-25 ENCOUNTER — Other Ambulatory Visit: Payer: Self-pay | Admitting: Family Medicine

## 2020-09-25 DIAGNOSIS — F419 Anxiety disorder, unspecified: Secondary | ICD-10-CM

## 2020-10-05 ENCOUNTER — Other Ambulatory Visit: Payer: Self-pay | Admitting: Family Medicine

## 2020-10-21 ENCOUNTER — Other Ambulatory Visit: Payer: Self-pay

## 2020-10-21 ENCOUNTER — Ambulatory Visit (INDEPENDENT_AMBULATORY_CARE_PROVIDER_SITE_OTHER): Payer: Medicare Other | Admitting: Family Medicine

## 2020-10-21 ENCOUNTER — Encounter: Payer: Self-pay | Admitting: Family Medicine

## 2020-10-21 VITALS — BP 114/76 | HR 90 | Temp 97.3°F | Ht 65.0 in | Wt 245.0 lb

## 2020-10-21 DIAGNOSIS — I4819 Other persistent atrial fibrillation: Secondary | ICD-10-CM

## 2020-10-21 DIAGNOSIS — R82998 Other abnormal findings in urine: Secondary | ICD-10-CM

## 2020-10-21 DIAGNOSIS — R059 Cough, unspecified: Secondary | ICD-10-CM | POA: Diagnosis not present

## 2020-10-21 DIAGNOSIS — I10 Essential (primary) hypertension: Secondary | ICD-10-CM | POA: Diagnosis not present

## 2020-10-21 DIAGNOSIS — D229 Melanocytic nevi, unspecified: Secondary | ICD-10-CM

## 2020-10-21 DIAGNOSIS — E782 Mixed hyperlipidemia: Secondary | ICD-10-CM

## 2020-10-21 DIAGNOSIS — D2262 Melanocytic nevi of left upper limb, including shoulder: Secondary | ICD-10-CM | POA: Diagnosis not present

## 2020-10-21 DIAGNOSIS — N183 Chronic kidney disease, stage 3 unspecified: Secondary | ICD-10-CM

## 2020-10-21 DIAGNOSIS — L819 Disorder of pigmentation, unspecified: Secondary | ICD-10-CM

## 2020-10-21 DIAGNOSIS — L430 Hypertrophic lichen planus: Secondary | ICD-10-CM

## 2020-10-21 HISTORY — DX: Melanocytic nevi, unspecified: D22.9

## 2020-10-21 MED ORDER — BENZONATATE 200 MG PO CAPS: 200.0000 mg | ORAL_CAPSULE | Freq: Two times a day (BID) | ORAL | 0 refills | Status: DC | PRN

## 2020-10-21 NOTE — Progress Notes (Addendum)
Patient: Jordan Ware MRN: [id number] DOB: 21-Sep-1950 PCP: Orma Flaming, MD     Subjective:  Chief Complaint  Patient presents with  . Hypertension  . Nasal Congestion    Pt says that she has been sick for 10 days today. It started with sore throat, and runny nose. She says it started with a sore throat, but denies fever and chills.   . Cough  . Chronic Kidney Disease  . Hyperlipidemia  . skin lesion  . urine concerns    HPI: The patient is a 70 y.o. female who presents today for HTN, afib, hyperlipidemia and CKD. She also has concerns is that her urine is not a good color. It will be a dark red/orange. If she drinks water it goes back to clear. She has no dysuria, urgency, frequency. She has no flank pain. It has been going on a for a few months. Not gotten any worse. Can not link it to any otc herbs/supplments, drinks.    Hypertension:  Here for follow up of hypertension.  Currently on bystolic 20mg . Takes medication as prescribed and denies any side effects. Exercise includes none. Weight has increased by 8 pounds. Denies any chest pain, headaches, shortness of breath, vision changes, swelling in lower extremities.   afib She doesn't really notice this at all. Denies any shortness of breath, leg swelling, cough, chest pain. On her xarelto 15mg /day.   Cough/congestion Symptoms started 10 days ago. Started with mild sore throat and drippy nose. She didn't feel that bad. Last Saturday she started to get really bad congestion and a cough. Her throat is better. No headaches and no fever. No shortness of breath, tightness in chest. Cough is dry in nature. No sinus pain or pressure. She hasn't really had fatigue. She has taken advil and delsym. This has helped some. She has been around her family who had covid about a month ago. (right after christmas). Her daughter is sick now with same symptoms.   Skin lesion  She also has a lesion on her left forearm. It has been there  for a couple of months. It has not grown. It is red with scales. Does not bleed or itch her. 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    Review of Systems  Constitutional: Negative for chills, fatigue and fever.  HENT: Positive for congestion. Negative for sinus pressure and sinus pain.   Respiratory: Positive for cough. Negative for chest tightness, shortness of breath and wheezing.   Cardiovascular: Negative for chest pain, palpitations and leg swelling.  Gastrointestinal: Negative for abdominal pain, diarrhea, nausea and vomiting.  Genitourinary: Negative for difficulty urinating, dysuria, flank pain, frequency and hematuria.       Urine color change   Skin: Positive for wound.    Allergies Patient has No Known Allergies.  Past Medical History Patient  has a past medical history of Anxiety (06/05/2017), Atrial fibrillation ([place]), Dysthymia (06/05/2017), Gallstones (1998), HLD (hyperlipidemia) (06/05/2017), and Hypertension.  Surgical History Patient  has a past surgical history that includes Appendectomy; Cholecystectomy; and Hernia repair (2005, 2010).  Family History Pateint's family history includes Breast cancer (age of onset: 77) in her mother; Colon cancer (age of onset: 86) in her mother; Prostate cancer in her brother.  Social History Patient  reports that she has never smoked. She has never used smokeless tobacco. She reports that she does not drink alcohol and does not use drugs.    Objective: Vitals:   10/21/20 1359  BP: 114/76  Pulse: 90  Temp: (!) 97.3 F (36.3 C)  TempSrc: Temporal  SpO2: 96%  Weight: 245 lb (111.1 kg)  Height: 5\' 5"  (1.651 m)    Body mass index is 40.77 kg/m.  Physical Exam Vitals reviewed.  Constitutional:      General: She is not in acute distress.    Appearance: Normal appearance. She is well-developed and well-nourished. She is obese. She is not ill-appearing.  HENT:     Head: Normocephalic and atraumatic.      Right Ear: Tympanic membrane, ear canal and external ear normal.     Left Ear: Tympanic membrane, ear canal and external ear normal.     Nose: Nose normal.     Mouth/Throat:     Mouth: Oropharynx is clear and moist. Mucous membranes are moist.  Eyes:     Extraocular Movements: Extraocular movements intact and EOM normal.     Conjunctiva/sclera: Conjunctivae normal.     Pupils: Pupils are equal, round, and reactive to light.  Neck:     Thyroid: No thyromegaly.     Vascular: No carotid bruit.  Cardiovascular:     Rate and Rhythm: Normal rate. Rhythm irregular.     Pulses: Intact distal pulses.     Heart sounds: Normal heart sounds. No murmur heard.   Pulmonary:     Effort: Pulmonary effort is normal.     Breath sounds: Normal breath sounds. No rhonchi or rales.  Abdominal:     General: Bowel sounds are normal. There is no distension.     Palpations: Abdomen is soft.     Tenderness: There is no abdominal tenderness. There is no right CVA tenderness or left CVA tenderness.  Musculoskeletal:     Cervical back: Normal range of motion and neck supple.  Lymphadenopathy:     Cervical: No cervical adenopathy.  Skin:    General: Skin is warm and dry.     Capillary Refill: Capillary refill takes less than 2 seconds.     Findings: Lesion (left lower arm. irregular erythematous patch with small macule in middle. some flaking of skin at top of lesion. <1cm in diameter. ) present. No rash.  Neurological:     General: No focal deficit present.     Mental Status: She is alert and oriented to person, place, and time.     Cranial Nerves: No cranial nerve deficit.     Coordination: Coordination normal.     Deep Tendon Reflexes: Reflexes normal.  Psychiatric:        Mood and Affect: Mood and affect and mood normal.        Behavior: Behavior normal.    [place] Office Visit from 04/03/2020 in [place]  PHQ-2 Total Score 0     Procedure note  Verbal consent  obtained. Risks and benefits discussed. Area cleaned with alcohol in sterile fashion. Shave biopsy performed. Patient tolerated well. drysol applied to achieve hemostasis. Bandage applied and wound care given. Sent to pathology.      Assessment/plan: 1. Cough Likely has covid. On day 10 day. Will test her and discussed conservative therapy at this point. Precautions given.  - Novel Coronavirus, NAA ([facility]); Future - Novel Coronavirus, NAA ([facility])  2. Essential hypertension Blood pressure is to goal. Continue current anti-hypertensive medication-bystolic 20mg /day. Refills not given and routine lab work will be done today. Recommended routine exercise and healthy diet including DASH diet and mediterranean diet. Encouraged weight loss. F/u in 6 months.   -  Comprehensive metabolic panel - CBC with Differential/Platelet  3. Persistent atrial fibrillation ([facility]) Rate controlled, on bystolic 20mg /day. Anti coagulated. Continue current treatment.   4. Stage 3 chronic kidney disease, unspecified whether stage 3a or 3b CKD ([place])   5. Mixed hyperlipidemia  - Lipid panel  6. Dark brown-colored urine ? If due to dehydration. Increase water and check urine studies/renal function. No other significant findings.   - Urinalysis, Routine w reflex microscopic - Urine Culture  7. Atypical pigmented skin lesion SK vs. AK vs. SCC. Shave biopsy done. Tolerated well. Wound care given.  - Dermatology pathology   This visit occurred during the SARS-CoV-2 public health emergency.  Safety protocols were in place, including screening questions prior to the visit, additional usage of staff PPE, and extensive cleaning of exam room while observing appropriate contact time as indicated for disinfecting solutions.      Return in about 6 months (around 04/20/2021) for htn/afib/ckd .    Orma Flaming, MD [place]   10/21/2020

## 2020-10-21 NOTE — Patient Instructions (Addendum)
For your cough 1) honey is good, cool mist humidifer, can continue with delsym and nyquil at night. If feel tight or short of breath I can send in pulmicort for you. On exam you sound good. Also sent in tessalon pearl prn for cough. covid testing you, but you would be out of quarantine starting today/tomorrow since it has bene 10 days.   2) biopsy done. Read instructions and if bleeds, heavy pressure x 20 minutes. If bleeding doesn't stop go to urgent care. Takes a few days for pathology to come back.   3) checking urine for you. If anything atypical will let you know if needs urology referral.   See you back in 6 months time! Dr. Rogers Blocker

## 2020-10-22 LAB — COMPREHENSIVE METABOLIC PANEL
ALT: 8 U/L (ref 0–35)
AST: 24 U/L (ref 0–37)
Albumin: 4.1 g/dL (ref 3.5–5.2)
Alkaline Phosphatase: 65 U/L (ref 39–117)
BUN: 17 mg/dL (ref 6–23)
CO2: 26 mEq/L (ref 19–32)
Calcium: 9.8 mg/dL (ref 8.4–10.5)
Chloride: 98 mEq/L (ref 96–112)
Creatinine, Ser: 1.64 mg/dL — ABNORMAL HIGH (ref 0.40–1.20)
GFR: 31.74 mL/min — ABNORMAL LOW (ref >60.00)
Glucose, Bld: 111 mg/dL — ABNORMAL HIGH (ref 70–99)
Potassium: 3.8 mEq/L (ref 3.5–5.1)
Sodium: 140 mEq/L (ref 135–145)
Total Bilirubin: 0.5 mg/dL (ref 0.2–1.2)
Total Protein: 8.1 g/dL (ref 6.0–8.3)

## 2020-10-22 LAB — CBC WITH DIFFERENTIAL/PLATELET
Basophils Absolute: 0.1 10*3/uL (ref 0.0–0.1)
Basophils Relative: 0.5 % (ref 0.0–3.0)
Eosinophils Absolute: 0.3 10*3/uL (ref 0.0–0.7)
Eosinophils Relative: 2.2 % (ref 0.0–5.0)
HCT: 34.5 % — ABNORMAL LOW (ref 36.0–46.0)
Hemoglobin: 11.2 g/dL — ABNORMAL LOW (ref 12.0–15.0)
Lymphocytes Relative: 19.9 % (ref 12.0–46.0)
Lymphs Abs: 2.9 10*3/uL (ref 0.7–4.0)
MCHC: 32.6 g/dL (ref 30.0–36.0)
MCV: 91.4 fl (ref 78.0–100.0)
Monocytes Absolute: 0.9 10*3/uL (ref 0.1–1.0)
Monocytes Relative: 6.5 % (ref 3.0–12.0)
Neutro Abs: 10.3 10*3/uL — ABNORMAL HIGH (ref 1.4–7.7)
Neutrophils Relative %: 70.9 % (ref 43.0–77.0)
Platelets: 477 10*3/uL — ABNORMAL HIGH (ref 150.0–400.0)
RBC: 3.77 Mil/uL — ABNORMAL LOW (ref 3.87–5.11)
RDW: 14 % (ref 11.5–15.5)
WBC: 14.4 10*3/uL — ABNORMAL HIGH (ref 4.0–10.5)

## 2020-10-22 LAB — LIPID PANEL
Cholesterol: 116 mg/dL (ref 0–200)
HDL: 57.8 mg/dL (ref >39.00)
LDL Cholesterol: 34 mg/dL (ref 0–99)
NonHDL: 58.4
Total CHOL/HDL Ratio: 2
Triglycerides: 122 mg/dL (ref 0.0–149.0)
VLDL: 24.4 mg/dL (ref 0.0–40.0)

## 2020-10-23 ENCOUNTER — Telehealth: Payer: Self-pay | Admitting: Family Medicine

## 2020-10-23 LAB — NOVEL CORONAVIRUS, NAA: SARS-CoV-2, NAA: NOT DETECTED

## 2020-10-23 LAB — SARS-COV-2, NAA 2 DAY TAT

## 2020-10-23 MED ORDER — AZITHROMYCIN 250 MG PO TABS: ORAL_TABLET | ORAL | 0 refills | Status: DC

## 2020-10-23 NOTE — Telephone Encounter (Signed)
Lvm for pt to call the office back. 

## 2020-10-23 NOTE — Telephone Encounter (Signed)
Please call and see how she is doing. Her white count was elevated suggesting an infection. If her cough is no better can she come in for chest xray today? I will also send in antibiotics. Make sure she checks her mychart as well for lab note.   Thanks,  Dr. Rogers Blocker

## 2020-10-23 NOTE — Addendum Note (Signed)
Addended by: Orma Flaming on: 10/23/2020 02:48 PM   Modules accepted: Orders

## 2020-10-23 NOTE — Telephone Encounter (Signed)
Abx (zpack) sent in since not coming in for cxr. Covid test pending still. She does feel a little better than when I saw her.  Jordan Flaming, MD [place]

## 2020-10-23 NOTE — Telephone Encounter (Signed)
Gave message below. She says that she is slightly better, but still coughing. She denies chest xray today. I recommended that she come in Monday for Chest xray.

## 2020-11-02 ENCOUNTER — Other Ambulatory Visit: Payer: Self-pay | Admitting: Family Medicine

## 2020-11-02 DIAGNOSIS — D229 Melanocytic nevi, unspecified: Secondary | ICD-10-CM

## 2020-11-02 NOTE — Progress Notes (Signed)
d 

## 2020-11-16 ENCOUNTER — Telehealth: Payer: Self-pay | Admitting: Physician Assistant

## 2020-11-16 NOTE — Telephone Encounter (Signed)
Referral attached to appointment

## 2020-11-16 NOTE — Telephone Encounter (Signed)
Patient is calling for a referral appointment from Orma Flaming, MD.  Patient is scheduled for 12/02/2020 at 3:00 with [facility], PA-C.

## 2020-12-02 ENCOUNTER — Ambulatory Visit (INDEPENDENT_AMBULATORY_CARE_PROVIDER_SITE_OTHER): Payer: Medicare Other | Admitting: Physician Assistant

## 2020-12-02 ENCOUNTER — Other Ambulatory Visit: Payer: Self-pay

## 2020-12-02 ENCOUNTER — Encounter: Payer: Self-pay | Admitting: Physician Assistant

## 2020-12-02 DIAGNOSIS — L988 Other specified disorders of the skin and subcutaneous tissue: Secondary | ICD-10-CM | POA: Diagnosis not present

## 2020-12-02 DIAGNOSIS — D485 Neoplasm of uncertain behavior of skin: Secondary | ICD-10-CM | POA: Diagnosis not present

## 2020-12-02 NOTE — Patient Instructions (Signed)

## 2020-12-21 ENCOUNTER — Telehealth: Payer: Self-pay | Admitting: Family Medicine

## 2020-12-21 NOTE — Telephone Encounter (Signed)
Left message for patient to call back and schedule Medicare Annual Wellness Visit (AWV) either virtually OR in office.   Last AWV 11/12/19; please schedule at anytime with [facility] Health Advisor at [facility].  This should be a 45 minute visit.

## 2020-12-22 ENCOUNTER — Other Ambulatory Visit: Payer: Self-pay | Admitting: Family Medicine

## 2020-12-22 DIAGNOSIS — F419 Anxiety disorder, unspecified: Secondary | ICD-10-CM

## 2020-12-27 ENCOUNTER — Other Ambulatory Visit: Payer: Self-pay | Admitting: Family Medicine

## 2020-12-27 DIAGNOSIS — I10 Essential (primary) hypertension: Secondary | ICD-10-CM

## 2020-12-28 ENCOUNTER — Encounter: Payer: Self-pay | Admitting: Physician Assistant

## 2020-12-28 NOTE — Progress Notes (Signed)
   Follow-Up Visit   Subjective  Jordan Ware is a 70 y.o. female who presents for the following: Skin Problem (Here to discuss atypical mole that was removed by Dr. Rogers Blocker).   The following portions of the chart were reviewed this encounter and updated as appropriate:  Tobacco  Allergies  Meds  Problems  Med Hx  Surg Hx  Fam Hx      Objective  Well appearing patient in no apparent distress; mood and affect are within normal limits.  A focused examination was performed including left forearm.. Relevant physical exam findings are noted in the Assessment and Plan.  Objective  Left Lower Arm: Bichromic dark nested macule.         Assessment & Plan  Neoplasm of uncertain behavior of skin Left Lower Arm  Epidermal / dermal shaving  Lesion diameter (cm):  1.6 Informed consent: discussed and consent obtained   Timeout: patient name, date of birth, surgical site, and procedure verified   Anesthesia: the lesion was anesthetized in a standard fashion   Anesthetic:  1% lidocaine w/ epinephrine 1-100,000 local infiltration Instrument used: flexible razor blade   Hemostasis achieved with: aluminum chloride   Outcome: patient tolerated procedure well   Post-procedure details: sterile dressing applied and wound care instructions given   Dressing type: bandage and petrolatum    Specimen 1 - Surgical pathology Differential Diagnosis: R/O Atypia  [id number]  Check Margins: No    I, Clayton Jarmon, PA-C, have reviewed all documentation's for this visit.  The documentation on 12/28/20 for the exam, diagnosis, procedures and orders are all accurate and complete.

## 2021-01-11 ENCOUNTER — Encounter: Payer: Self-pay | Admitting: Family Medicine

## 2021-01-11 ENCOUNTER — Other Ambulatory Visit: Payer: Self-pay

## 2021-01-11 DIAGNOSIS — F341 Dysthymic disorder: Secondary | ICD-10-CM

## 2021-01-11 MED ORDER — ESCITALOPRAM OXALATE 20 MG PO TABS: 1.0000 | ORAL_TABLET | Freq: Every day | ORAL | 1 refills | Status: DC

## 2021-01-11 NOTE — Telephone Encounter (Signed)
Pt requesting Xanax 0.5 mg tab LOV: 10/21/2020 Next Visit: 04/29/2021; Pt needs PCP  Please Advise.

## 2021-03-17 DIAGNOSIS — Z23 Encounter for immunization: Secondary | ICD-10-CM | POA: Diagnosis not present

## 2021-03-22 ENCOUNTER — Other Ambulatory Visit: Payer: Self-pay | Admitting: Family Medicine

## 2021-03-22 DIAGNOSIS — F419 Anxiety disorder, unspecified: Secondary | ICD-10-CM

## 2021-03-22 NOTE — Telephone Encounter (Signed)
Pt is requesting Xanax  0.5 mg tab LOV: 10/21/2020 Next Visit: 05/13/2021 Last refill: 12/23/2020  Not to refill before 03/24/2021

## 2021-04-29 ENCOUNTER — Ambulatory Visit: Payer: Medicare Other | Admitting: Family Medicine

## 2021-05-13 ENCOUNTER — Ambulatory Visit (INDEPENDENT_AMBULATORY_CARE_PROVIDER_SITE_OTHER): Payer: Medicare Other | Admitting: Physician Assistant

## 2021-05-13 ENCOUNTER — Encounter: Payer: Self-pay | Admitting: Physician Assistant

## 2021-05-13 ENCOUNTER — Other Ambulatory Visit: Payer: Self-pay

## 2021-05-13 VITALS — BP 123/88 | HR 72 | Temp 97.5°F | Ht 65.0 in | Wt 237.6 lb

## 2021-05-13 DIAGNOSIS — R82998 Other abnormal findings in urine: Secondary | ICD-10-CM | POA: Diagnosis not present

## 2021-05-13 DIAGNOSIS — Z23 Encounter for immunization: Secondary | ICD-10-CM | POA: Diagnosis not present

## 2021-05-13 DIAGNOSIS — Z1211 Encounter for screening for malignant neoplasm of colon: Secondary | ICD-10-CM

## 2021-05-13 DIAGNOSIS — I10 Essential (primary) hypertension: Secondary | ICD-10-CM

## 2021-05-13 DIAGNOSIS — N183 Chronic kidney disease, stage 3 unspecified: Secondary | ICD-10-CM

## 2021-05-13 LAB — COMPREHENSIVE METABOLIC PANEL
ALT: 12 U/L (ref 0–35)
AST: 19 U/L (ref 0–37)
Albumin: 3.9 g/dL (ref 3.5–5.2)
Alkaline Phosphatase: 54 U/L (ref 39–117)
BUN: 20 mg/dL (ref 6–23)
CO2: 29 mEq/L (ref 19–32)
Calcium: 9.8 mg/dL (ref 8.4–10.5)
Chloride: 102 mEq/L (ref 96–112)
Creatinine, Ser: 1.39 mg/dL — ABNORMAL HIGH (ref 0.40–1.20)
GFR: 38.56 mL/min — ABNORMAL LOW (ref >60.00)
Glucose, Bld: 96 mg/dL (ref 70–99)
Potassium: 4.1 mEq/L (ref 3.5–5.1)
Sodium: 140 mEq/L (ref 135–145)
Total Bilirubin: 0.5 mg/dL (ref 0.2–1.2)
Total Protein: 7.4 g/dL (ref 6.0–8.3)

## 2021-05-13 LAB — CBC WITH DIFFERENTIAL/PLATELET
Basophils Absolute: 0.1 10*3/uL (ref 0.0–0.1)
Basophils Relative: 0.7 % (ref 0.0–3.0)
Eosinophils Absolute: 0.1 10*3/uL (ref 0.0–0.7)
Eosinophils Relative: 1.6 % (ref 0.0–5.0)
HCT: 34.1 % — ABNORMAL LOW (ref 36.0–46.0)
Hemoglobin: 11.1 g/dL — ABNORMAL LOW (ref 12.0–15.0)
Lymphocytes Relative: 29.5 % (ref 12.0–46.0)
Lymphs Abs: 2.2 10*3/uL (ref 0.7–4.0)
MCHC: 32.6 g/dL (ref 30.0–36.0)
MCV: 89.9 fl (ref 78.0–100.0)
Monocytes Absolute: 0.5 10*3/uL (ref 0.1–1.0)
Monocytes Relative: 6.3 % (ref 3.0–12.0)
Neutro Abs: 4.7 10*3/uL (ref 1.4–7.7)
Neutrophils Relative %: 61.9 % (ref 43.0–77.0)
Platelets: 393 10*3/uL (ref 150.0–400.0)
RBC: 3.79 Mil/uL — ABNORMAL LOW (ref 3.87–5.11)
RDW: 14.6 % (ref 11.5–15.5)
WBC: 7.5 10*3/uL (ref 4.0–10.5)

## 2021-05-13 NOTE — Progress Notes (Signed)
Established Patient Office Visit  Subjective:  Patient ID: Jordan Ware, female    DOB: 1951/07/02  Age: 70 y.o. MRN: [id number]  CC:  Chief Complaint  Patient presents with   Establish Care    HPI Jordan Ware presents for transfer of care visit from Dr. Rogers Blocker to myself.  She has no acute concerns today.  She has been taking her medications as directed.  Past Medical History:  Diagnosis Date   Anxiety 06/05/2017   Atrial fibrillation ([place])    Atypical mole 10/21/2020   Left Lower Arm (Moderate - Dr. Orma Flaming) [facility]   Dysthymia 06/05/2017   Gallstones 1998   HLD (hyperlipidemia) 06/05/2017   Hypertension     Past Surgical History:  Procedure Laterality Date   APPENDECTOMY     CHOLECYSTECTOMY     partial   HERNIA REPAIR  2005, 2010    Family History  Problem Relation Age of Onset   Breast cancer Mother 69   Colon cancer Mother 53   Prostate cancer Brother    Rectal cancer Neg Hx     Social History   Socioeconomic History   Marital status: Widowed    Spouse name: Not on file   Number of children: 1   Years of education: Not on file   Highest education level: Not on file  Occupational History   Occupation: Retired   Tobacco Use   Smoking status: Never   Smokeless tobacco: Never  Vaping Use   Vaping Use: Never used  Substance and Sexual Activity   Alcohol use: No   Drug use: No   Sexual activity: Yes    Partners: Male  Other Topics Concern   Not on file  Social History Narrative   Lives alone    1 daughter- 1 grandchild    Drives without difficulty    Social Determinants of Health   Financial Resource Strain: Not on file  Food Insecurity: Not on file  Transportation Needs: Not on file  Physical Activity: Not on file  Stress: Not on file  Social Connections: Not on file  Intimate Partner Violence: Not on file    Outpatient Medications Prior to Visit  Medication Sig Dispense Refill   ALPRAZolam (XANAX) 0.5 MG  tablet TAKE 1 TABLET BY MOUTH AT BEDTIME AS NEEDED FOR ANXIETY 90 tablet 0   ASHWAGANDHA PO Take by mouth daily. '400mg'$      escitalopram (LEXAPRO) 20 MG tablet Take 1 tablet (20 mg total) by mouth daily. 90 tablet 1   Flaxseed, Linseed, (FLAXSEED OIL) 1200 MG CAPS Take 1 capsule by mouth daily.     Multiple Vitamin (MULTI-VITAMIN DAILY PO) Take by mouth daily.     Nebivolol HCl 20 MG TABS TAKE 1 TABLET BY MOUTH EVERY DAY 90 tablet 1   rosuvastatin (CRESTOR) 5 MG tablet TAKE 1 TABLET BY MOUTH EVERY DAY 90 tablet 3   XARELTO 15 MG TABS tablet TAKE 1 TABLET BY MOUTH EVERY DAY 90 tablet 3   azithromycin (ZITHROMAX) 250 MG tablet 2 pills today and then 1 pill days 2-5. Hold cholesterol drug while on this. 6 tablet 0   benzonatate (TESSALON) 200 MG capsule Take 1 capsule (200 mg total) by mouth 2 (two) times daily as needed for cough. 20 capsule 0   gabapentin (NEURONTIN) 300 MG capsule Take 1 capsule (300 mg total) by mouth 3 (three) times daily as needed. For trigeminal neuralgia pain 90 capsule 3   No facility-administered medications prior to  visit.    No Known Allergies  ROS Review of Systems  Constitutional:  Negative for chills, fatigue and fever.  HENT:  Negative for dental problem, ear pain, hearing loss and trouble swallowing.   Eyes:  Negative for visual disturbance.  Respiratory:  Negative for cough, chest tightness and shortness of breath.   Cardiovascular:  Negative for chest pain, palpitations and leg swelling.  Gastrointestinal:  Negative for abdominal pain, blood in stool, diarrhea and nausea.  Endocrine: Negative for cold intolerance, polydipsia, polyphagia and polyuria.  Genitourinary:  Negative for dysuria, frequency, hematuria and urgency.  Musculoskeletal:  Negative for arthralgias.  Skin:  Negative for rash.  Neurological:  Negative for dizziness and headaches.  Psychiatric/Behavioral:  Negative for dysphoric mood and sleep disturbance. The patient is not  nervous/anxious.      Objective:    Physical Exam Vitals reviewed.  Constitutional:      Appearance: Normal appearance. She is obese.  HENT:     Head: Normocephalic and atraumatic.  Cardiovascular:     Rate and Rhythm: Rhythm irregular.     Heart sounds: Normal heart sounds.  Pulmonary:     Effort: Pulmonary effort is normal.     Breath sounds: Normal breath sounds.  Abdominal:     General: Bowel sounds are normal.     Palpations: Abdomen is soft.  Neurological:     General: No focal deficit present.     Mental Status: She is alert and oriented to person, place, and time.  Psychiatric:        Mood and Affect: Mood normal.        Behavior: Behavior normal.    BP 123/88   Pulse 72   Temp (!) 97.5 F (36.4 C) (Temporal)   Ht '5\' 5"'$  (1.651 m)   Wt 237 lb 9.6 oz (107.8 kg)   SpO2 95%   BMI 39.54 kg/m  Wt Readings from Last 3 Encounters:  05/13/21 237 lb 9.6 oz (107.8 kg)  10/21/20 245 lb (111.1 kg)  04/03/20 238 lb 3.2 oz (108 kg)     Health Maintenance Due  Topic Date Due   Zoster Vaccines- Shingrix (1 of 2) Never done   PNA vac Low Risk Adult (2 of 2 - PCV13) 01/11/2019   COVID-19 Vaccine (4 - Booster for Pfizer series) 09/16/2020   INFLUENZA VACCINE  03/29/2021   COLONOSCOPY (Pts 45-81yr Insurance coverage will need to be confirmed)  04/18/2021    There are no preventive care reminders to display for this patient.  Lab Results  Component Value Date   TSH 2.91 03/27/2019   Lab Results  Component Value Date   WBC 14.4 (H) 10/21/2020   HGB 11.2 (L) 10/21/2020   HCT 34.5 (L) 10/21/2020   MCV 91.4 10/21/2020   PLT 477.0 (H) 10/21/2020   Lab Results  Component Value Date   NA 140 10/21/2020   K 3.8 10/21/2020   CO2 26 10/21/2020   GLUCOSE 111 (H) 10/21/2020   BUN 17 10/21/2020   CREATININE 1.64 (H) 10/21/2020   BILITOT 0.5 10/21/2020   ALKPHOS 65 10/21/2020   AST 24 10/21/2020   ALT 8 10/21/2020   PROT 8.1 10/21/2020   ALBUMIN 4.1 10/21/2020    CALCIUM 9.8 10/21/2020   ANIONGAP 13 05/28/2017   GFR 31.74 (L) 10/21/2020   Lab Results  Component Value Date   CHOL 116 10/21/2020   Lab Results  Component Value Date   HDL 57.80 10/21/2020   Lab Results  Component Value Date   LDLCALC 34 10/21/2020   Lab Results  Component Value Date   TRIG 122.0 10/21/2020   Lab Results  Component Value Date   CHOLHDL 2 10/21/2020   No results found for: HGBA1C    Assessment & Plan:   Problem List Items Addressed This Visit   None  1. Stage 3 chronic kidney disease, unspecified whether stage 3a or 3b CKD ([place]) 2. Dark brown-colored urine -This is not new for the patient - She acknowledges that she needs to drink more water -We will check CMP today and see where things stand with her renal function as well as urinalysis with microscopy -Encouraged to avoid all NSAIDs  3. Essential hypertension Blood pressure is stable to goal. She will continue nebivolol HCl 20 mg once daily.  4. Screening for colon cancer Referral placed for screening colonoscopy  5. Influenza vaccination administered at current visit -High-dose flu vaccine administered at today's visit.   Follow-up: No follow-ups on file.    Roch Quach M Saleen Peden, PA-C

## 2021-05-13 NOTE — Patient Instructions (Signed)
Good to meet you today! Please go to the lab for blood work and I will send results through [place]. Flu vaccine updated today. Continue current medication regimen. Call if any concerns.

## 2021-06-11 ENCOUNTER — Telehealth: Payer: Self-pay

## 2021-06-11 NOTE — Telephone Encounter (Signed)
LVM for patient to call back and make a Lab only appt to leave urine.

## 2021-06-16 ENCOUNTER — Other Ambulatory Visit: Payer: Medicare Other

## 2021-06-16 ENCOUNTER — Other Ambulatory Visit: Payer: Self-pay

## 2021-06-16 LAB — URINALYSIS, ROUTINE W REFLEX MICROSCOPIC

## 2021-06-18 ENCOUNTER — Other Ambulatory Visit: Payer: Self-pay

## 2021-06-18 ENCOUNTER — Telehealth: Payer: Self-pay

## 2021-06-18 DIAGNOSIS — R82998 Other abnormal findings in urine: Secondary | ICD-10-CM

## 2021-06-18 NOTE — Telephone Encounter (Signed)
Patient is calling back in regard to lab results.   Have advised, Isla Pence will give a call back at [phone].

## 2021-06-18 NOTE — Telephone Encounter (Signed)
See Results notes

## 2021-06-18 NOTE — Telephone Encounter (Signed)
Pt called stating that [facility] is needing PA for Alprazolam. Please Advise.

## 2021-06-19 ENCOUNTER — Other Ambulatory Visit: Payer: Self-pay | Admitting: Physician Assistant

## 2021-06-19 DIAGNOSIS — R82998 Other abnormal findings in urine: Secondary | ICD-10-CM

## 2021-06-19 DIAGNOSIS — N183 Chronic kidney disease, stage 3 unspecified: Secondary | ICD-10-CM

## 2021-06-19 DIAGNOSIS — R310 Gross hematuria: Secondary | ICD-10-CM

## 2021-06-19 NOTE — Progress Notes (Signed)
Order for CT placed

## 2021-06-21 NOTE — Telephone Encounter (Signed)
Waiting for PA through fax

## 2021-06-22 ENCOUNTER — Other Ambulatory Visit: Payer: Self-pay | Admitting: Family Medicine

## 2021-06-22 DIAGNOSIS — F419 Anxiety disorder, unspecified: Secondary | ICD-10-CM

## 2021-06-23 MED ORDER — ALPRAZOLAM 0.5 MG PO TABS: 0.5000 mg | ORAL_TABLET | Freq: Every evening | ORAL | 0 refills | Status: DC | PRN

## 2021-06-23 NOTE — Telephone Encounter (Signed)
I spoke with pharmacy, Rx is ready for pick up no PA needed. Patient notified

## 2021-06-23 NOTE — Telephone Encounter (Signed)
Patient states she has been trying to request this medication for a few days between our office and [facility].  Patient is currently out of medication.  Would like to be sent in as soon as possible.    Please give patient a call once sent in at [phone].

## 2021-07-03 ENCOUNTER — Other Ambulatory Visit: Payer: Self-pay | Admitting: Family Medicine

## 2021-07-03 DIAGNOSIS — I10 Essential (primary) hypertension: Secondary | ICD-10-CM

## 2021-07-13 ENCOUNTER — Ambulatory Visit
Admission: RE | Admit: 2021-07-13 | Discharge: 2021-07-13 | Disposition: A | Discharge status: Home / Self Care | Payer: Medicare Other | Source: Ambulatory Visit | Attending: Physician Assistant | Admitting: Physician Assistant

## 2021-07-13 DIAGNOSIS — R82998 Other abnormal findings in urine: Secondary | ICD-10-CM

## 2021-07-13 DIAGNOSIS — R310 Gross hematuria: Secondary | ICD-10-CM

## 2021-07-13 DIAGNOSIS — K8020 Calculus of gallbladder without cholecystitis without obstruction: Secondary | ICD-10-CM | POA: Diagnosis not present

## 2021-07-13 DIAGNOSIS — N183 Chronic kidney disease, stage 3 unspecified: Secondary | ICD-10-CM

## 2021-07-13 DIAGNOSIS — K5730 Diverticulosis of large intestine without perforation or abscess without bleeding: Secondary | ICD-10-CM | POA: Diagnosis not present

## 2021-07-13 DIAGNOSIS — R319 Hematuria, unspecified: Secondary | ICD-10-CM | POA: Diagnosis not present

## 2021-07-21 ENCOUNTER — Encounter: Payer: Self-pay | Admitting: Physician Assistant

## 2021-07-21 ENCOUNTER — Telehealth (INDEPENDENT_AMBULATORY_CARE_PROVIDER_SITE_OTHER): Payer: Medicare Other | Admitting: Physician Assistant

## 2021-07-21 DIAGNOSIS — D508 Other iron deficiency anemias: Secondary | ICD-10-CM

## 2021-07-21 DIAGNOSIS — Z8719 Personal history of other diseases of the digestive system: Secondary | ICD-10-CM

## 2021-07-21 DIAGNOSIS — R310 Gross hematuria: Secondary | ICD-10-CM | POA: Diagnosis not present

## 2021-07-21 DIAGNOSIS — I700 Atherosclerosis of aorta: Secondary | ICD-10-CM | POA: Diagnosis not present

## 2021-07-21 DIAGNOSIS — N183 Chronic kidney disease, stage 3 unspecified: Secondary | ICD-10-CM | POA: Diagnosis not present

## 2021-07-21 DIAGNOSIS — R82998 Other abnormal findings in urine: Secondary | ICD-10-CM | POA: Diagnosis not present

## 2021-07-21 NOTE — Progress Notes (Signed)
Virtual Visit via Video Note  I connected with  Jordan Ware  on 07/21/21 at  1:30 PM EST by a video enabled telemedicine application and verified that I am speaking with the correct person using two identifiers.  Location: Patient: home Provider: Therapist, music at [place] present: Patient and myself   I discussed the limitations of evaluation and management by telemedicine and the availability of in person appointments. The patient expressed understanding and agreed to proceed.   History of Present Illness:  70 yo patient presents for virtual visit to discuss recent CT abd pelv w/o contrast report on 07/13/21:  IMPRESSION: 1. There is a 1.3 cm calculus in the central left renal pelvis with associated renal pelvic fat stranding. Additional small calculus of the inferior pole of the right kidney. No left-sided calculi or hydronephrosis. 2. Gallstones and sludge contracted in the gallbladder.   Aortic Atherosclerosis (ICD10-I70.0).  I last saw patient in person on 05-13-21. I asked if she had any issues scheduling the CT and she claims it was something she kept putting off. I asked how she is doing and she states that she is still having dark brown / red colored urine. Clears up if she really pushes fluids, but turns this color again in the mornings. This was mentioned at visit in February with Jordan Ware and patient did not give a urine sample at that time to follow up on.   No dysuria. No frequency or urgency with urination. No abdominal pain or flank pain. No fevers or chills or night sweats.  Pt stats she passed a renal stone at about age 15, took about one week to pass; diagnosed through the ED. No stones since that time.   "Gallstones and sludge contracted in the gallbladder":  -1998 - gallbladder attack in [place]. She was told she had her gallbladder removed during surgery.  -2018 - pain RUQ while in [place]. She went to the ED and she was told  only part of her gallbladder had been removed. She did see specialist and was told if she has a flare-up they would remove the rest at that time.   Observations/Objective:   Gen: Awake, alert, no acute distress Resp: Breathing is even and non-labored Psych: calm/pleasant demeanor Neuro: Alert and Oriented x 3, + facial symmetry, speech is clear.   Assessment and Plan:  1. Dark brown-colored urine 2. Stage 3 chronic kidney disease, unspecified whether stage 3a or 3b CKD ([place]) 3. Gross hematuria -I'Jordan very concerned about her ongoing symptoms and the "renal pelvic fat stranding" reported on her CT. I am going to send urgent referral to urology at this time. -Pt understands low theshold for ED should any acute symptoms arise. -Need to recheck CMP.  4. Other iron deficiency anemia -Need to recheck CBC.  5. History of gallstones -Stated history, reported on CT, no symptoms. Monitor at this time.   6. Aortic atherosclerosis ([facility]) -Currently on Crestor 5 mg daily. Last lipid doing well.  Lab Results  Component Value Date   CHOL 116 10/21/2020   HDL 57.80 10/21/2020   LDLCALC 34 10/21/2020   LDLDIRECT 134.0 10/03/2018   TRIG 122.0 10/21/2020   CHOLHDL 2 10/21/2020     Daughter is her transportation and caregiver - on crutches for foot surgery and also having hysterectomy next week. Discussed with patient depending on when urology appt is, we can try to connect her with some resources at [facility] to get her there. We need  to have labs repeated soon as well.    Follow Up Instructions:    I discussed the assessment and treatment plan with the patient. The patient was provided an opportunity to ask questions and all were answered. The patient agreed with the plan and demonstrated an understanding of the instructions.   The patient was advised to call back or seek an in-person evaluation if the symptoms worsen or if the condition fails to improve as anticipated.  Video connection was  lost at >50% of the duration of the visit, at which time the remainder of the visit was completed via audio only. Total time spent with patient on the phone was 24 minutes and 24 seconds.    Jordan Ware Jordan Muaaz Brau, PA-C

## 2021-07-26 ENCOUNTER — Telehealth: Payer: Self-pay

## 2021-07-26 NOTE — Telephone Encounter (Signed)
LVM for patient to call back and schedule labs per Alyssa  for CBC, iron panel, and CMP.

## 2021-07-27 ENCOUNTER — Other Ambulatory Visit: Payer: Self-pay | Admitting: *Deleted

## 2021-07-27 ENCOUNTER — Other Ambulatory Visit (INDEPENDENT_AMBULATORY_CARE_PROVIDER_SITE_OTHER): Payer: Medicare Other

## 2021-07-27 ENCOUNTER — Other Ambulatory Visit: Payer: Self-pay

## 2021-07-27 DIAGNOSIS — I10 Essential (primary) hypertension: Secondary | ICD-10-CM | POA: Diagnosis not present

## 2021-07-27 DIAGNOSIS — E782 Mixed hyperlipidemia: Secondary | ICD-10-CM

## 2021-07-27 DIAGNOSIS — D508 Other iron deficiency anemias: Secondary | ICD-10-CM

## 2021-07-27 LAB — CBC WITH DIFFERENTIAL/PLATELET
Basophils Absolute: 0.1 10*3/uL (ref 0.0–0.1)
Basophils Relative: 0.9 % (ref 0.0–3.0)
Eosinophils Absolute: 0.1 10*3/uL (ref 0.0–0.7)
Eosinophils Relative: 1.8 % (ref 0.0–5.0)
HCT: 28.2 % — ABNORMAL LOW (ref 36.0–46.0)
Hemoglobin: 8.9 g/dL — ABNORMAL LOW (ref 12.0–15.0)
Lymphocytes Relative: 27.5 % (ref 12.0–46.0)
Lymphs Abs: 2 10*3/uL (ref 0.7–4.0)
MCHC: 31.5 g/dL (ref 30.0–36.0)
MCV: 82.7 fl (ref 78.0–100.0)
Monocytes Absolute: 0.5 10*3/uL (ref 0.1–1.0)
Monocytes Relative: 6.3 % (ref 3.0–12.0)
Neutro Abs: 4.6 10*3/uL (ref 1.4–7.7)
Neutrophils Relative %: 63.5 % (ref 43.0–77.0)
Platelets: 451 10*3/uL — ABNORMAL HIGH (ref 150.0–400.0)
RBC: 3.41 Mil/uL — ABNORMAL LOW (ref 3.87–5.11)
RDW: 14.9 % (ref 11.5–15.5)
WBC: 7.2 10*3/uL (ref 4.0–10.5)

## 2021-07-27 LAB — COMPREHENSIVE METABOLIC PANEL
ALT: 8 U/L (ref 0–35)
AST: 16 U/L (ref 0–37)
Albumin: 4 g/dL (ref 3.5–5.2)
Alkaline Phosphatase: 51 U/L (ref 39–117)
BUN: 21 mg/dL (ref 6–23)
CO2: 29 mEq/L (ref 19–32)
Calcium: 9.8 mg/dL (ref 8.4–10.5)
Chloride: 102 mEq/L (ref 96–112)
Creatinine, Ser: 1.33 mg/dL — ABNORMAL HIGH (ref 0.40–1.20)
GFR: 40.6 mL/min — ABNORMAL LOW (ref >60.00)
Glucose, Bld: 109 mg/dL — ABNORMAL HIGH (ref 70–99)
Potassium: 4.4 mEq/L (ref 3.5–5.1)
Sodium: 140 mEq/L (ref 135–145)
Total Bilirubin: 0.5 mg/dL (ref 0.2–1.2)
Total Protein: 7.4 g/dL (ref 6.0–8.3)

## 2021-07-27 NOTE — Progress Notes (Unsigned)
i

## 2021-07-28 ENCOUNTER — Telehealth: Payer: Self-pay | Admitting: Physician Assistant

## 2021-07-28 ENCOUNTER — Other Ambulatory Visit: Payer: Self-pay | Admitting: Physician Assistant

## 2021-07-28 ENCOUNTER — Encounter: Payer: Self-pay | Admitting: Physician Assistant

## 2021-07-28 LAB — IRON,TIBC AND FERRITIN PANEL
%SAT: 7 % (calc) — ABNORMAL LOW (ref 16–45)
Ferritin: 5 ng/mL — ABNORMAL LOW (ref 16–288)
Iron: 30 ug/dL — ABNORMAL LOW (ref 45–160)
TIBC: 433 mcg/dL (calc) (ref 250–450)

## 2021-07-28 MED ORDER — IRON (FERROUS SULFATE) 325 (65 FE) MG PO TABS: 325.0000 mg | ORAL_TABLET | Freq: Every day | ORAL | 1 refills | Status: DC

## 2021-07-28 NOTE — Telephone Encounter (Signed)
Copied from [place] [id number]. Topic: Medicare AWV >> Jul 28, 2021 11:27 AM Harris-Coley, Hannah Beat wrote: Reason for CRM: Left message for patient to schedule Annual Wellness Visit.  Please schedule with Nurse Health Advisor Charlott Rakes, RN at [facility].  Please call [phone] ask for [facility]

## 2021-08-07 ENCOUNTER — Other Ambulatory Visit: Payer: Self-pay | Admitting: Family Medicine

## 2021-08-07 DIAGNOSIS — I4819 Other persistent atrial fibrillation: Secondary | ICD-10-CM

## 2021-08-18 ENCOUNTER — Encounter: Payer: Self-pay | Admitting: *Deleted

## 2021-08-20 ENCOUNTER — Other Ambulatory Visit: Payer: Self-pay | Admitting: Physician Assistant

## 2021-08-23 ENCOUNTER — Other Ambulatory Visit: Payer: Self-pay | Admitting: Family Medicine

## 2021-08-23 DIAGNOSIS — F341 Dysthymic disorder: Secondary | ICD-10-CM

## 2021-09-08 DIAGNOSIS — R310 Gross hematuria: Secondary | ICD-10-CM | POA: Diagnosis not present

## 2021-09-08 DIAGNOSIS — N200 Calculus of kidney: Secondary | ICD-10-CM | POA: Diagnosis not present

## 2021-09-16 ENCOUNTER — Telehealth: Payer: Self-pay | Admitting: Physician Assistant

## 2021-09-16 NOTE — Telephone Encounter (Signed)
See note

## 2021-09-16 NOTE — Telephone Encounter (Signed)
[facility] called to state pt is having surgery on 10/01/21 for kidney stone. They are wanting her to stop the Xarelto for 3 days prior to the surgery. They will need approval for her to do this. They are also faxing an approval form if needed. Please call [phone] ext.[phone]

## 2021-09-18 ENCOUNTER — Other Ambulatory Visit: Payer: Self-pay | Admitting: Family Medicine

## 2021-09-22 NOTE — Telephone Encounter (Signed)
Left message to return call to our office at their convenience.  

## 2021-09-23 ENCOUNTER — Other Ambulatory Visit: Payer: Self-pay | Admitting: Physician Assistant

## 2021-09-23 DIAGNOSIS — F419 Anxiety disorder, unspecified: Secondary | ICD-10-CM

## 2021-09-23 MED ORDER — ALPRAZOLAM 0.5 MG PO TABS: 0.5000 mg | ORAL_TABLET | Freq: Every evening | ORAL | 0 refills | Status: DC | PRN

## 2021-09-24 ENCOUNTER — Other Ambulatory Visit: Payer: Self-pay | Admitting: Physician Assistant

## 2021-09-27 ENCOUNTER — Ambulatory Visit (INDEPENDENT_AMBULATORY_CARE_PROVIDER_SITE_OTHER): Payer: Medicare Other | Admitting: Internal Medicine

## 2021-09-27 ENCOUNTER — Other Ambulatory Visit (INDEPENDENT_AMBULATORY_CARE_PROVIDER_SITE_OTHER): Payer: Medicare Other

## 2021-09-27 ENCOUNTER — Encounter: Payer: Self-pay | Admitting: Internal Medicine

## 2021-09-27 VITALS — BP 102/72 | HR 88 | Ht 65.0 in | Wt 236.0 lb

## 2021-09-27 DIAGNOSIS — Z86010 Personal history of colonic polyps: Secondary | ICD-10-CM

## 2021-09-27 DIAGNOSIS — Z7901 Long term (current) use of anticoagulants: Secondary | ICD-10-CM

## 2021-09-27 DIAGNOSIS — D509 Iron deficiency anemia, unspecified: Secondary | ICD-10-CM

## 2021-09-27 LAB — CBC WITH DIFFERENTIAL/PLATELET
Basophils Absolute: 0 10*3/uL (ref 0.0–0.1)
Basophils Relative: 0.6 % (ref 0.0–3.0)
Eosinophils Absolute: 0.1 10*3/uL (ref 0.0–0.7)
Eosinophils Relative: 1.8 % (ref 0.0–5.0)
HCT: 30.7 % — ABNORMAL LOW (ref 36.0–46.0)
Hemoglobin: 9.7 g/dL — ABNORMAL LOW (ref 12.0–15.0)
Lymphocytes Relative: 23.5 % (ref 12.0–46.0)
Lymphs Abs: 1.9 10*3/uL (ref 0.7–4.0)
MCHC: 31.5 g/dL (ref 30.0–36.0)
MCV: 81.5 fl (ref 78.0–100.0)
Monocytes Absolute: 0.6 10*3/uL (ref 0.1–1.0)
Monocytes Relative: 8 % (ref 3.0–12.0)
Neutro Abs: 5.3 10*3/uL (ref 1.4–7.7)
Neutrophils Relative %: 66.1 % (ref 43.0–77.0)
Platelets: 399 10*3/uL (ref 150.0–400.0)
RBC: 3.76 Mil/uL — ABNORMAL LOW (ref 3.87–5.11)
RDW: 18 % — ABNORMAL HIGH (ref 11.5–15.5)
WBC: 8 10*3/uL (ref 4.0–10.5)

## 2021-09-27 LAB — IBC + FERRITIN
Ferritin: 5.4 ng/mL — ABNORMAL LOW (ref 10.0–291.0)
Iron: 29 ug/dL — ABNORMAL LOW (ref 42–145)
Saturation Ratios: 6.5 % — ABNORMAL LOW (ref 20.0–50.0)
TIBC: 448 ug/dL (ref 250.0–450.0)
Transferrin: 320 mg/dL (ref 212.0–360.0)

## 2021-09-27 NOTE — Telephone Encounter (Signed)
Jordan Ware is calling back in regard.  Has not received form and needs to send over to anesthesia.  Is requesting call back at [phone].   Will fax form again.

## 2021-09-27 NOTE — Patient Instructions (Signed)
Your provider has requested that you go to the basement level for lab work before leaving today. Press "B" on the elevator. The lab is located at the first door on the left as you exit the elevator.  It has been recommended to you by your physician that you have a(n) Colonoscopy completed. Per your request we did not schedule the procedure(s) today. Please contact our office at [phone] to let us know when you are ready to schedule.   If you are age 40 or older, your body mass index should be between 23-30. Your Body mass index is 39.27 kg/m. If this is out of the aforementioned range listed, please consider follow up with your Primary Care Provider.  ________________________________________________________  The [facility] providers would like to encourage you to use [facility] to communicate with providers for non-urgent requests or questions.  Due to long hold times on the telephone, sending your provider a message by [facility] may be a faster and more efficient way to get a response.  Please allow 48 business hours for a response.  Please remember that this is for non-urgent requests.  _______________________________________________________  Due to recent changes in healthcare laws, you may see the results of your imaging and laboratory studies on MyChart before your provider has had a chance to review them.  We understand that in some cases there may be results that are confusing or concerning to you. Not all laboratory results come back in the same time frame and the provider may be waiting for multiple results in order to interpret others.  Please give [place] 48 hours in order for your provider to thoroughly review all the results before contacting the office for clarification of your results.   Thank you for choosing me and [facility].  Dr.Jay Pyrtle

## 2021-09-27 NOTE — Progress Notes (Signed)
HPI: Jordan Ware is a 71 year old female with a history of multiple adenomatous and sessile serrated polyps, diverticulosis, small hemorrhoids, recent iron deficiency anemia felt secondary to urinary blood loss related to kidney stone, hyperlipidemia, atrial fibrillation on Xarelto who is seen to discuss surveillance colonoscopy.  She is here alone today.  She is known to me from her last colonoscopy performed on 04/18/2018.  On this exam 9 polyps were removed all less than a centimeter.  5 of these polyps were adenomatous and one was sessile serrated polyp.  No high-grade dysplasia or malignancy.  There was diverticulosis in the left colon and small internal hemorrhoids.  She reports from a GI perspective she is doing well though she was prescribed oral iron and found this very constipating.  She was also having GI upset.  She stopped her oral iron.  She reports her hemoglobin was found to be 8.9.  She is seeing [facility] and has plans later this week for stone removal and stent placement.  She denies upper GI and hepatobiliary complaints.  No frequent heartburn, nausea or vomiting.  Appetite good.  No weight loss.  Without the oral iron her bowel movements are fairly regular.  She does take Xarelto for her atrial fibrillation.  She denies chest pain or dyspnea.  Past Medical History:  Diagnosis Date   Anxiety 06/05/2017   Atrial fibrillation ([place])    Atypical mole 10/21/2020   Left Lower Arm (Moderate - Dr. Orma Flaming) [facility]   Diverticulosis    Dysthymia 06/05/2017   Gallstones 1998   HLD (hyperlipidemia) 06/05/2017   Hypertension    Internal hemorrhoids    Tubular adenoma of colon     Past Surgical History:  Procedure Laterality Date   APPENDECTOMY     CHOLECYSTECTOMY     partial   HERNIA REPAIR  2005, 2010    Outpatient Medications Prior to Visit  Medication Sig Dispense Refill   ALPRAZolam (XANAX) 0.5 MG tablet Take 1 tablet (0.5 mg total) by mouth at  bedtime as needed for anxiety. TAKE 1 TABLET BY MOUTH AT BEDTIME AS NEEDED FOR ANXIETY 90 tablet 0   escitalopram (LEXAPRO) 20 MG tablet TAKE 1 TABLET BY MOUTH EVERY DAY 90 tablet 1   Flaxseed, Linseed, (FLAXSEED OIL) 1200 MG CAPS Take 1 capsule by mouth daily.     Multiple Vitamin (MULTI-VITAMIN DAILY PO) Take by mouth daily.     Nebivolol HCl 20 MG TABS TAKE 1 TABLET BY MOUTH EVERY DAY 90 tablet 1   rosuvastatin (CRESTOR) 5 MG tablet TAKE 1 TABLET BY MOUTH EVERY DAY 90 tablet 3   XARELTO 15 MG TABS tablet TAKE 1 TABLET BY MOUTH EVERY DAY 90 tablet 3   ASHWAGANDHA PO Take by mouth daily. 400mg      azithromycin (ZITHROMAX) 250 MG tablet 2 pills today and then 1 pill days 2-5. Hold cholesterol drug while on this. 6 tablet 0   benzonatate (TESSALON) 200 MG capsule Take 1 capsule (200 mg total) by mouth 2 (two) times daily as needed for cough. 20 capsule 0   ferrous sulfate 325 (65 FE) MG tablet TAKE 1 TABLET BY MOUTH DAILY. TAKE WITH VITAMIN C. (Patient not taking: Reported on 09/27/2021) 30 tablet 1   gabapentin (NEURONTIN) 300 MG capsule Take 1 capsule (300 mg total) by mouth 3 (three) times daily as needed. For trigeminal neuralgia pain 90 capsule 3   No facility-administered medications prior to visit.    No Known Allergies  Family History  Problem Relation Age of Onset   Breast cancer Mother 10   Colon cancer Mother 75   Prostate cancer Brother    Pancreatic cancer Maternal Grandmother    Rectal cancer Neg Hx    Esophageal cancer Neg Hx    Stomach cancer Neg Hx     Social History   Tobacco Use   Smoking status: Never   Smokeless tobacco: Never  Vaping Use   Vaping Use: Never used  Substance Use Topics   Alcohol use: No   Drug use: No    ROS: As per history of present illness, otherwise negative  Ht 5\' 5"  (1.651 m)    Wt 236 lb (107 kg)    BMI 39.27 kg/m  Gen: awake, alert, NAD HEENT: anicteric, op clear CV: RRR, no mrg Pulm: CTA b/l Abd: soft, NT/ND, +BS  throughout Ext: no c/c/e Neuro: nonfocal   RELEVANT LABS AND IMAGING: CBC    Component Value Date/Time   WBC 7.2 07/27/2021 1442   RBC 3.41 (L) 07/27/2021 1442   HGB 8.9 (L) 07/27/2021 1442   HCT 28.2 (L) 07/27/2021 1442   PLT 451.0 (H) 07/27/2021 1442   MCV 82.7 07/27/2021 1442   MCH 31.7 04/03/2020 1409   MCHC 31.5 07/27/2021 1442   RDW 14.9 07/27/2021 1442   LYMPHSABS 2.0 07/27/2021 1442   MONOABS 0.5 07/27/2021 1442   EOSABS 0.1 07/27/2021 1442   BASOSABS 0.1 07/27/2021 1442    CMP     Component Value Date/Time   NA 140 07/27/2021 1442   K 4.4 07/27/2021 1442   CL 102 07/27/2021 1442   CO2 29 07/27/2021 1442   GLUCOSE 109 (H) 07/27/2021 1442   BUN 21 07/27/2021 1442   CREATININE 1.33 (H) 07/27/2021 1442   CREATININE 1.25 (H) 04/03/2020 1409   CALCIUM 9.8 07/27/2021 1442   PROT 7.4 07/27/2021 1442   ALBUMIN 4.0 07/27/2021 1442   AST 16 07/27/2021 1442   ALT 8 07/27/2021 1442   ALKPHOS 51 07/27/2021 1442   BILITOT 0.5 07/27/2021 1442   GFRNONAA 33 (L) 05/28/2017 1159   GFRAA 39 (L) 05/28/2017 1159   Iron/TIBC/Ferritin/ %Sat    Component Value Date/Time   IRON 29 (L) 09/27/2021 1419   TIBC 448.0 09/27/2021 1419   FERRITIN 5.4 (L) 09/27/2021 1419   IRONPCTSAT 6.5 (L) 09/27/2021 1419   IRONPCTSAT 7 (L) 07/27/2021 1442    ASSESSMENT/PLAN: 71 year old female with a history of multiple adenomatous and sessile serrated polyps, diverticulosis, small hemorrhoids, recent iron deficiency anemia felt secondary to urinary blood loss related to kidney stone, hyperlipidemia, atrial fibrillation on Xarelto who is seen to discuss surveillance colonoscopy.    History of multiple adenomatous colon polyps --surveillance colonoscopy is recommended at this time.  She would like to delay this for a few months to allow time for her to complete her urologic procedures.  We will need to hold Xarelto for 48 hours prior to this procedure.  We did review the risk, benefits and  alternatives to surveillance colonoscopy and she is agreeable and wishes to proceed --Colonoscopy in the [place] recommended --Will hold Xarelto 2 days prior to endoscopic procedures - will instruct when and how to resume after procedure. Benefits and risks of procedure explained including risks of bleeding, perforation, infection, missed lesions, reactions to medications and possible need for hospitalization and surgery for complications. Additional rare but real risk of stroke or other vascular clotting events off Xarelto also explained and need to seek urgent help if any  signs of these problems occur. Will communicate by phone or EMR with patient's  prescribing provider to confirm that holding Xarelto is reasonable in this case.   2.  Iron deficiency anemia --felt secondary to urinary blood loss from kidney stone.  Urologic treatment is planned.  She was intolerant to oral iron.  I repeated CBC and iron studies.  She remains iron deficient.  IV iron is recommended. --I recommended IV iron, she will let us know if she is agreeable.  I am happy to order this through the [facility] if she wishes to proceed       Cc:[place], Randa Evens, [place] [address] [place] [place],  [place] [address]

## 2021-09-28 ENCOUNTER — Encounter: Payer: Self-pay | Admitting: Internal Medicine

## 2021-09-28 ENCOUNTER — Encounter: Payer: Self-pay | Admitting: Physician Assistant

## 2021-09-28 NOTE — Telephone Encounter (Signed)
Form Faxed--

## 2021-09-29 ENCOUNTER — Telehealth: Payer: Self-pay

## 2021-09-29 NOTE — Telephone Encounter (Signed)
-----   Message from Jerene Bears, MD sent at 09/28/2021  1:55 PM EST ----- Jordan Ware,  I saw this patient yesterday.  She has iron deficiency.  Let me know if she wants to proceed with IV iron and I will order it with the infusion center Thanks JMP

## 2021-09-29 NOTE — Telephone Encounter (Signed)
Spoke with pt and she states she is having a procedure Friday to take care of the kidney stone. She Is not interested in having IV Iron at this time.

## 2021-10-01 DIAGNOSIS — N200 Calculus of kidney: Secondary | ICD-10-CM | POA: Diagnosis not present

## 2021-10-01 DIAGNOSIS — R310 Gross hematuria: Secondary | ICD-10-CM | POA: Diagnosis not present

## 2021-10-01 DIAGNOSIS — N201 Calculus of ureter: Secondary | ICD-10-CM | POA: Diagnosis not present

## 2021-10-01 HISTORY — PX: OTHER SURGICAL HISTORY: SHX169

## 2021-10-08 DIAGNOSIS — N200 Calculus of kidney: Secondary | ICD-10-CM | POA: Diagnosis not present

## 2021-10-08 DIAGNOSIS — R350 Frequency of micturition: Secondary | ICD-10-CM | POA: Diagnosis not present

## 2021-10-15 ENCOUNTER — Telehealth: Payer: Self-pay | Admitting: Physician Assistant

## 2021-10-15 NOTE — Telephone Encounter (Signed)
Copied from [place] [id number]. Topic: Medicare AWV >> Oct 15, 2021 10:14 AM Harris-Coley, Hannah Beat wrote: Reason for CRM: Left message for patient to schedule Annual Wellness Visit.  Please schedule with Nurse Health Advisor Charlott Rakes, RN at [facility].  Please call [phone] ask for [facility]

## 2021-10-19 DIAGNOSIS — Z961 Presence of intraocular lens: Secondary | ICD-10-CM | POA: Diagnosis not present

## 2021-10-28 ENCOUNTER — Other Ambulatory Visit: Payer: Self-pay

## 2021-10-28 ENCOUNTER — Ambulatory Visit (INDEPENDENT_AMBULATORY_CARE_PROVIDER_SITE_OTHER): Payer: Medicare Other

## 2021-10-28 DIAGNOSIS — Z0000 Encounter for general adult medical examination without abnormal findings: Secondary | ICD-10-CM

## 2021-10-28 NOTE — Patient Instructions (Signed)
[name] , Thank you for taking time to come for your Medicare Wellness Visit. I appreciate your ongoing commitment to your health goals. Please review the following plan we discussed and let me know if I can assist you in the future.   Screening recommendations/referrals: Colonoscopy: Done 04/18/18 pt will confer with GI per due date  Mammogram: Done 06/12/20 repeat every year  Bone Density: Done 11/28/17 repeat every 2 years  Recommended yearly ophthalmology/optometry visit for glaucoma screening and checkup Recommended yearly dental visit for hygiene and checkup  Vaccinations: Influenza vaccine: Done 05/13/21 repeat every year  Pneumococcal vaccine: Up to date Tdap vaccine: Done 02/02/18 repeat every 10 years  Shingles vaccine: Declined and discussed    Covid-19:Completed 2/25, 3/28, 06/24/20 & 03/17/21  Advanced directives: Please bring a copy of your health care power of attorney and living will to the office at your convenience.  Conditions/risks identified: lose a few lbs   Next appointment: Follow up in one year for your annual wellness visit    Preventive Care 65 Years and Older, Female Preventive care refers to lifestyle choices and visits with your health care provider that can promote health and wellness. What does preventive care include? A yearly physical exam. This is also called an annual well check. Dental exams once or twice a year. Routine eye exams. Ask your health care provider how often you should have your eyes checked. Personal lifestyle choices, including: Daily care of your teeth and gums. Regular physical activity. Eating a healthy diet. Avoiding tobacco and drug use. Limiting alcohol use. Practicing safe sex. Taking low-dose aspirin every day. Taking vitamin and mineral supplements as recommended by your health care provider. What happens during an annual well check? The services and screenings done by your health care provider during your annual well  check will depend on your age, overall health, lifestyle risk factors, and family history of disease. Counseling  Your health care provider may ask you questions about your: Alcohol use. Tobacco use. Drug use. Emotional well-being. Home and relationship well-being. Sexual activity. Eating habits. History of falls. Memory and ability to understand (cognition). Work and work Statistician. Reproductive health. Screening  You may have the following tests or measurements: Height, weight, and BMI. Blood pressure. Lipid and cholesterol levels. These may be checked every 5 years, or more frequently if you are over 59 years old. Skin check. Lung cancer screening. You may have this screening every year starting at age 2 if you have a 30-pack-year history of smoking and currently smoke or have quit within the past 15 years. Fecal occult blood test (FOBT) of the stool. You may have this test every year starting at age 69. Flexible sigmoidoscopy or colonoscopy. You may have a sigmoidoscopy every 5 years or a colonoscopy every 10 years starting at age 63. Hepatitis C blood test. Hepatitis B blood test. Sexually transmitted disease (STD) testing. Diabetes screening. This is done by checking your blood sugar (glucose) after you have not eaten for a while (fasting). You may have this done every 1-3 years. Bone density scan. This is done to screen for osteoporosis. You may have this done starting at age 67. Mammogram. This may be done every 1-2 years. Talk to your health care provider about how often you should have regular mammograms. Talk with your health care provider about your test results, treatment options, and if necessary, the need for more tests. Vaccines  Your health care provider may recommend certain vaccines, such as: Influenza vaccine. This  is recommended every year. Tetanus, diphtheria, and acellular pertussis (Tdap, Td) vaccine. You may need a Td booster every 10 years. Zoster  vaccine. You may need this after age 16. Pneumococcal 13-valent conjugate (PCV13) vaccine. One dose is recommended after age 53. Pneumococcal polysaccharide (PPSV23) vaccine. One dose is recommended after age 67. Talk to your health care provider about which screenings and vaccines you need and how often you need them. This information is not intended to replace advice given to you by your health care provider. Make sure you discuss any questions you have with your health care provider. Document Released: 09/11/2015 Document Revised: 05/04/2016 Document Reviewed: 06/16/2015 Elsevier Interactive Patient Education  2017 [employer] Prevention in the Home Falls can cause injuries. They can happen to people of all ages. There are many things you can do to make your home safe and to help prevent falls. What can I do on the outside of my home? Regularly fix the edges of walkways and driveways and fix any cracks. Remove anything that might make you trip as you walk through a door, such as a raised step or threshold. Trim any bushes or trees on the path to your home. Use bright outdoor lighting. Clear any walking paths of anything that might make someone trip, such as rocks or tools. Regularly check to see if handrails are loose or broken. Make sure that both sides of any steps have handrails. Any raised decks and porches should have guardrails on the edges. Have any leaves, snow, or ice cleared regularly. Use sand or salt on walking paths during winter. Clean up any spills in your garage right away. This includes oil or grease spills. What can I do in the bathroom? Use night lights. Install grab bars by the toilet and in the tub and shower. Do not use towel bars as grab bars. Use non-skid mats or decals in the tub or shower. If you need to sit down in the shower, use a plastic, non-slip stool. Keep the floor dry. Clean up any water that spills on the floor as soon as it happens. Remove  soap buildup in the tub or shower regularly. Attach bath mats securely with double-sided non-slip rug tape. Do not have throw rugs and other things on the floor that can make you trip. What can I do in the bedroom? Use night lights. Make sure that you have a light by your bed that is easy to reach. Do not use any sheets or blankets that are too big for your bed. They should not hang down onto the floor. Have a firm chair that has side arms. You can use this for support while you get dressed. Do not have throw rugs and other things on the floor that can make you trip. What can I do in the kitchen? Clean up any spills right away. Avoid walking on wet floors. Keep items that you use a lot in easy-to-reach places. If you need to reach something above you, use a strong step stool that has a grab bar. Keep electrical cords out of the way. Do not use floor polish or wax that makes floors slippery. If you must use wax, use non-skid floor wax. Do not have throw rugs and other things on the floor that can make you trip. What can I do with my stairs? Do not leave any items on the stairs. Make sure that there are handrails on both sides of the stairs and use them. Fix handrails that  are broken or loose. Make sure that handrails are as long as the stairways. Check any carpeting to make sure that it is firmly attached to the stairs. Fix any carpet that is loose or worn. Avoid having throw rugs at the top or bottom of the stairs. If you do have throw rugs, attach them to the floor with carpet tape. Make sure that you have a light switch at the top of the stairs and the bottom of the stairs. If you do not have them, ask someone to add them for you. What else can I do to help prevent falls? Wear shoes that: Do not have high heels. Have rubber bottoms. Are comfortable and fit you well. Are closed at the toe. Do not wear sandals. If you use a stepladder: Make sure that it is fully opened. Do not climb a  closed stepladder. Make sure that both sides of the stepladder are locked into place. Ask someone to hold it for you, if possible. Clearly mark and make sure that you can see: Any grab bars or handrails. First and last steps. Where the edge of each step is. Use tools that help you move around (mobility aids) if they are needed. These include: Canes. Walkers. Scooters. Crutches. Turn on the lights when you go into a dark area. Replace any light bulbs as soon as they burn out. Set up your furniture so you have a clear path. Avoid moving your furniture around. If any of your floors are uneven, fix them. If there are any pets around you, be aware of where they are. Review your medicines with your doctor. Some medicines can make you feel dizzy. This can increase your chance of falling. Ask your doctor what other things that you can do to help prevent falls. This information is not intended to replace advice given to you by your health care provider. Make sure you discuss any questions you have with your health care provider. Document Released: 06/11/2009 Document Revised: 01/21/2016 Document Reviewed: 09/19/2014 Elsevier Interactive Patient Education  2017 [employer].

## 2021-10-28 NOTE — Progress Notes (Signed)
Virtual Visit via Telephone Note  I connected with  Jordan Ware on 10/28/21 at  1:45 PM EST by telephone and verified that I am speaking with the correct person using two identifiers.  Medicare Annual Wellness visit completed telephonically due to Covid-19 pandemic.   Persons participating in this call: This Health Coach and this patient.   Location: Patient: Home  Provider: office    I discussed the limitations, risks, security and privacy concerns of performing an evaluation and management service by telephone and the availability of in person appointments. The patient expressed understanding and agreed to proceed.  Unable to perform video visit due to video visit attempted and failed and/or patient does not have video capability.   Some vital signs may be absent or patient reported.   Willette Brace, LPN   Subjective:   Jordan Ware is a 71 y.o. female who presents for Medicare Annual (Subsequent) preventive examination.  Review of Systems     Cardiac Risk Factors include: advanced age (>31men, >44 women);dyslipidemia;hypertension;obesity (BMI >30kg/m2)     Objective:    There were no vitals filed for this visit. There is no height or weight on file to calculate BMI.  Advanced Directives 11/12/2019 10/31/2018  Does Patient Have a Medical Advance Directive? Yes Yes  Type of Paramedic of [place];Living will Living will  Does patient want to make changes to medical advance directive? No - Patient declined No - Patient declined  Copy of [place] in Chart? No - copy requested -    Current Medications (verified) Outpatient Encounter Medications as of 10/28/2021  Medication Sig   ALPRAZolam (XANAX) 0.5 MG tablet Take 1 tablet (0.5 mg total) by mouth at bedtime as needed for anxiety. TAKE 1 TABLET BY MOUTH AT BEDTIME AS NEEDED FOR ANXIETY   escitalopram (LEXAPRO) 20 MG tablet TAKE 1 TABLET BY MOUTH EVERY DAY    Flaxseed, Linseed, (FLAXSEED OIL) 1200 MG CAPS Take 1 capsule by mouth daily.   Multiple Vitamin (MULTI-VITAMIN DAILY PO) Take by mouth daily.   Nebivolol HCl 20 MG TABS TAKE 1 TABLET BY MOUTH EVERY DAY   rosuvastatin (CRESTOR) 5 MG tablet TAKE 1 TABLET BY MOUTH EVERY DAY   XARELTO 15 MG TABS tablet TAKE 1 TABLET BY MOUTH EVERY DAY   No facility-administered encounter medications on file as of 10/28/2021.    Allergies (verified) Patient has no known allergies.   History: Past Medical History:  Diagnosis Date   Anxiety 06/05/2017   Atrial fibrillation ([place])    Atypical mole 10/21/2020   Left Lower Arm (Moderate - Dr. Orma Flaming) [facility]   Diverticulosis    Dysthymia 06/05/2017   Gallstones 1998   HLD (hyperlipidemia) 06/05/2017   Hypertension    Internal hemorrhoids    Tubular adenoma of colon    Past Surgical History:  Procedure Laterality Date   APPENDECTOMY     CHOLECYSTECTOMY     partial   HERNIA REPAIR  2005, 2010   kidney stone Right 10/01/2021   Family History  Problem Relation Age of Onset   Breast cancer Mother 26   Colon cancer Mother 73   Prostate cancer Brother    Pancreatic cancer Maternal Grandmother    Rectal cancer Neg Hx    Esophageal cancer Neg Hx    Stomach cancer Neg Hx    Social History   Socioeconomic History   Marital status: Widowed    Spouse name: Not on file  Number of children: 1   Years of education: Not on file   Highest education level: Not on file  Occupational History   Occupation: Retired   Tobacco Use   Smoking status: Never   Smokeless tobacco: Never  Vaping Use   Vaping Use: Never used  Substance and Sexual Activity   Alcohol use: No   Drug use: No   Sexual activity: Yes    Partners: Male  Other Topics Concern   Not on file  Social History Narrative   Lives alone    1 daughter- 1 grandchild    Drives without difficulty    Social Determinants of Health   Financial Resource Strain: Low Risk    Difficulty  of Paying Living Expenses: Not hard at all  Food Insecurity: No Food Insecurity   Worried About Charity fundraiser in the Last Year: Never true   Arboriculturist in the Last Year: Never true  Transportation Needs: No Transportation Needs   Lack of Transportation (Medical): No   Lack of Transportation (Non-Medical): No  Physical Activity: Inactive   Days of Exercise per Week: 0 days   Minutes of Exercise per Session: 0 min  Stress: No Stress Concern Present   Feeling of Stress : Not at all  Social Connections: Socially Isolated   Frequency of Communication with Friends and Family: More than three times a week   Frequency of Social Gatherings with Friends and Family: Once a week   Attends Religious Services: Never   Marine scientist or Organizations: No   Attends Archivist Meetings: Never   Marital Status: Widowed    Tobacco Counseling Counseling given: Not Answered   Clinical Intake:  Pre-visit preparation completed: Yes  Pain : No/denies pain     BMI - recorded: 38.3 Nutritional Status: BMI > 30  Obese Nutritional Risks: None Diabetes: No  How often do you need to have someone help you when you read instructions, pamphlets, or other written materials from your doctor or pharmacy?: 1 - Never  Diabetic?no  Interpreter Needed?: No  Information entered by :: Charlott Rakes, LPN   Activities of Daily Living In your present state of health, do you have any difficulty performing the following activities: 10/28/2021  Hearing? N  Vision? N  Difficulty concentrating or making decisions? N  Walking or climbing stairs? Y  Comment leg pain  Dressing or bathing? N  Doing errands, shopping? N  Preparing Food and eating ? N  Using the Toilet? N  In the past six months, have you accidently leaked urine? N  Do you have problems with loss of bowel control? N  Managing your Medications? N  Managing your Finances? N  Housekeeping or managing your Housekeeping?  N  Some recent data might be hidden    Patient Care Team: Allwardt, Randa Evens, PA-C as PCP - General (Physician Assistant) Jola Schmidt, MD as Consulting Physician (Ophthalmology) Lavonna Monarch, MD as Consulting Physician (Dermatology)  Indicate any recent Medical Services you may have received from other than [facility] providers in the past year (date may be approximate).     Assessment:   This is a routine wellness examination for [place].  Hearing/Vision screen Hearing Screening - Comments:: Pt denies any hearing issues  Vision Screening - Comments:: Pt follows up with Dr Jola Schmidt for annual eye exams   Dietary issues and exercise activities discussed: Current Exercise Habits: The patient does not participate in regular exercise at present  Goals Addressed             This Visit's Progress    Patient Stated       Lose a few lbs        Depression Screen PHQ 2/9 Scores 10/28/2021 04/03/2020 11/12/2019 10/02/2019 07/31/2019 03/27/2019 10/31/2018  PHQ - 2 Score 0 0 0 0 0 0 0  PHQ- 9 Score - - - 3 - 3 3    Fall Risk Fall Risk  10/28/2021 05/13/2021 04/03/2020 11/12/2019 07/31/2019  Falls in the past year? 0 0 0 0 0  Number falls in past yr: 0 0 - 0 0  Injury with Fall? 0 - - 0 0  Risk for fall due to : - - - - -  Follow up Falls prevention discussed - - Falls evaluation completed;Education provided;Falls prevention discussed -    FALL RISK PREVENTION PERTAINING TO THE HOME:  Any stairs in or around the home? No  If so, are there any without handrails? No  Home free of loose throw rugs in walkways, pet beds, electrical cords, etc? Yes  Adequate lighting in your home to reduce risk of falls? Yes   ASSISTIVE DEVICES UTILIZED TO PREVENT FALLS:  Life alert? No  Use of a cane, walker or w/c? No  Grab bars in the bathroom? Yes  Shower chair or bench in shower? No  Elevated toilet seat or a handicapped toilet? No   TIMED UP AND GO:  Was the test performed? No .   Cognitive  Function: MMSE - Mini Mental State Exam 10/31/2018  Orientation to time 5  Orientation to Place 5  Registration 3  Attention/ Calculation 5  Recall 3  Language- name 2 objects 2  Language- repeat 1  Language- follow 3 step command 3  Language- read & follow direction 1  Write a sentence 1  Copy design 1  Total score 30     6CIT Screen 10/28/2021 11/12/2019  What Year? 0 points 0 points  What month? 0 points 0 points  What time? 0 points 0 points  Count back from 20 0 points 0 points  Months in reverse 0 points 0 points  Repeat phrase 0 points 0 points  Total Score 0 0    Immunizations Immunization History  Administered Date(s) Administered   Fluad Quad(high Dose 65+) 05/02/2019, 05/13/2021   Influenza, High Dose Seasonal PF 05/03/2018   Influenza,inj,Quad PF,6+ Mos 06/05/2017   Influenza-Unspecified 06/24/2020   PFIZER(Purple Top)SARS-COV-2 Vaccination 10/24/2019, 11/24/2019, 06/24/2020, 03/17/2021   Pneumococcal Polysaccharide-23 01/10/2018   Tdap 02/02/2018    TDAP status: Up to date  Flu Vaccine status: Up to date  Pneumococcal vaccine status: Up to date  Covid-19 vaccine status: Completed vaccines  Qualifies for Shingles Vaccine? Yes   Zostavax completed No   Shingrix Completed?: No.    Education has been provided regarding the importance of this vaccine. Patient has been advised to call insurance company to determine out of pocket expense if they have not yet received this vaccine. Advised may also receive vaccine at local pharmacy or Health Dept. Verbalized acceptance and understanding.  Screening Tests Health Maintenance  Topic Date Due   COLONOSCOPY (Pts 45-23yrs Insurance coverage will need to be confirmed)  04/18/2021   COVID-19 Vaccine (5 - Booster for Pfizer series) 05/12/2021   Pneumonia Vaccine 68+ Years old (2 - PCV) 10/29/2022 (Originally 01/11/2019)   MAMMOGRAM  06/12/2022   TETANUS/TDAP  02/03/2028   INFLUENZA VACCINE  Completed   DEXA SCAN  Completed   Hepatitis C Screening  Completed   HPV VACCINES  Aged Out   Zoster Vaccines- Shingrix  Discontinued    Health Maintenance  Health Maintenance Due  Topic Date Due   COLONOSCOPY (Pts 45-35yrs Insurance coverage will need to be confirmed)  04/18/2021   COVID-19 Vaccine (5 - Booster for Pfizer series) 05/12/2021    Colorectal cancer screening: Type of screening: Colonoscopy. Completed 04/18/18. Repeat every 3 years  Mammogram status: Completed 06/12/20. Repeat every year  Bone Density status: Completed 11/28/17. Results reflect: Bone density results: NORMAL. Repeat every 2 years.  Additional Screening:  Hepatitis C Screening:  Completed 10/17/18  Vision Screening: Recommended annual ophthalmology exams for early detection of glaucoma and other disorders of the eye. Is the patient up to date with their annual eye exam?  Yes  Who is the provider or what is the name of the office in which the patient attends annual eye exams? Dr Valetta Close If pt is not established with a provider, would they like to be referred to a provider to establish care? No .   Dental Screening: Recommended annual dental exams for proper oral hygiene  Community Resource Referral / Chronic Care Management: CRR required this visit?  No   CCM required this visit?  No      Plan:     I have personally reviewed and noted the following in the patients chart:   Medical and social history Use of alcohol, tobacco or illicit drugs  Current medications and supplements including opioid prescriptions.  Functional ability and status Nutritional status Physical activity Advanced directives List of other physicians Hospitalizations, surgeries, and ER visits in previous 12 months Vitals Screenings to include cognitive, depression, and falls Referrals and appointments  In addition, I have reviewed and discussed with patient certain preventive protocols, quality metrics, and best practice recommendations. A  written personalized care plan for preventive services as well as general preventive health recommendations were provided to patient.     Willette Brace, LPN   04/06/2118   Nurse Notes: None

## 2021-11-01 DIAGNOSIS — N200 Calculus of kidney: Secondary | ICD-10-CM | POA: Diagnosis not present

## 2021-11-10 ENCOUNTER — Ambulatory Visit: Payer: Medicare Other | Admitting: Physician Assistant

## 2021-11-17 ENCOUNTER — Encounter: Payer: Self-pay | Admitting: Physician Assistant

## 2021-11-17 ENCOUNTER — Ambulatory Visit (INDEPENDENT_AMBULATORY_CARE_PROVIDER_SITE_OTHER): Payer: Medicare Other | Admitting: Physician Assistant

## 2021-11-17 VITALS — BP 119/81 | HR 81 | Temp 98.0°F | Ht 65.5 in | Wt 229.0 lb

## 2021-11-17 DIAGNOSIS — N183 Chronic kidney disease, stage 3 unspecified: Secondary | ICD-10-CM

## 2021-11-17 DIAGNOSIS — I4819 Other persistent atrial fibrillation: Secondary | ICD-10-CM

## 2021-11-17 DIAGNOSIS — R739 Hyperglycemia, unspecified: Secondary | ICD-10-CM | POA: Diagnosis not present

## 2021-11-17 DIAGNOSIS — R222 Localized swelling, mass and lump, trunk: Secondary | ICD-10-CM

## 2021-11-17 DIAGNOSIS — R82998 Other abnormal findings in urine: Secondary | ICD-10-CM | POA: Diagnosis not present

## 2021-11-17 DIAGNOSIS — E782 Mixed hyperlipidemia: Secondary | ICD-10-CM

## 2021-11-17 DIAGNOSIS — D508 Other iron deficiency anemias: Secondary | ICD-10-CM | POA: Diagnosis not present

## 2021-11-17 LAB — CBC WITH DIFFERENTIAL/PLATELET
Basophils Absolute: 0.1 10*3/uL (ref 0.0–0.1)
Basophils Relative: 0.6 % (ref 0.0–3.0)
Eosinophils Absolute: 0.2 10*3/uL (ref 0.0–0.7)
Eosinophils Relative: 1.9 % (ref 0.0–5.0)
HCT: 35.5 % — ABNORMAL LOW (ref 36.0–46.0)
Hemoglobin: 11 g/dL — ABNORMAL LOW (ref 12.0–15.0)
Lymphocytes Relative: 27.4 % (ref 12.0–46.0)
Lymphs Abs: 2.4 10*3/uL (ref 0.7–4.0)
MCHC: 31.1 g/dL (ref 30.0–36.0)
MCV: 81.2 fl (ref 78.0–100.0)
Monocytes Absolute: 0.7 10*3/uL (ref 0.1–1.0)
Monocytes Relative: 7.5 % (ref 3.0–12.0)
Neutro Abs: 5.6 10*3/uL (ref 1.4–7.7)
Neutrophils Relative %: 62.6 % (ref 43.0–77.0)
Platelets: 410 10*3/uL — ABNORMAL HIGH (ref 150.0–400.0)
RBC: 4.38 Mil/uL (ref 3.87–5.11)
RDW: 18.2 % — ABNORMAL HIGH (ref 11.5–15.5)
WBC: 8.9 10*3/uL (ref 4.0–10.5)

## 2021-11-17 LAB — COMPREHENSIVE METABOLIC PANEL
ALT: 10 U/L (ref 0–35)
AST: 18 U/L (ref 0–37)
Albumin: 4.2 g/dL (ref 3.5–5.2)
Alkaline Phosphatase: 60 U/L (ref 39–117)
BUN: 17 mg/dL (ref 6–23)
CO2: 29 mEq/L (ref 19–32)
Calcium: 9.7 mg/dL (ref 8.4–10.5)
Chloride: 102 mEq/L (ref 96–112)
Creatinine, Ser: 1.28 mg/dL — ABNORMAL HIGH (ref 0.40–1.20)
GFR: 42.42 mL/min — ABNORMAL LOW (ref >60.00)
Glucose, Bld: 100 mg/dL — ABNORMAL HIGH (ref 70–99)
Potassium: 3.9 mEq/L (ref 3.5–5.1)
Sodium: 143 mEq/L (ref 135–145)
Total Bilirubin: 0.6 mg/dL (ref 0.2–1.2)
Total Protein: 7.3 g/dL (ref 6.0–8.3)

## 2021-11-17 LAB — HEMOGLOBIN A1C: Hgb A1c MFr Bld: 5.8 % (ref 4.6–6.5)

## 2021-11-17 LAB — LIPID PANEL
Cholesterol: 131 mg/dL (ref 0–200)
HDL: 73.2 mg/dL (ref >39.00)
LDL Cholesterol: 38 mg/dL (ref 0–99)
NonHDL: 57.65
Total CHOL/HDL Ratio: 2
Triglycerides: 97 mg/dL (ref 0.0–149.0)
VLDL: 19.4 mg/dL (ref 0.0–40.0)

## 2021-11-17 NOTE — Progress Notes (Signed)
? ?Subjective:  ? ? Patient ID: Jordan Ware, female    DOB: 1951-02-03, 71 y.o.   MRN: [id number] ? ?Chief Complaint  ?Patient presents with  ? Follow-up  ? ? ?HPI ?Patient is in today for regular follow-up. ? ?Since last visit, Dr. Gloriann Loan performed cystoscopy with stent placement for one week 10/01/21. ?Ultrasound two weeks ago stone completely gone.  ?Finally has clear urine for the first time in one year and is feeling much better overall. No longer as fatigued and able to "take a whole shower standing."  ?First renal stone at age 32 - came out spontaneously.  ? ?Continues regular chronic medications. See A/P for details. ?No other concerns just needing fasting labs today.  ? ?Past Medical History:  ?Diagnosis Date  ? Anxiety 06/05/2017  ? Atrial fibrillation ([place])   ? Atypical mole 10/21/2020  ? Left Lower Arm (Moderate - Dr. Orma Flaming) [facility]  ? Diverticulosis   ? Dysthymia 06/05/2017  ? Gallstones 1998  ? HLD (hyperlipidemia) 06/05/2017  ? Hypertension   ? Internal hemorrhoids   ? Tubular adenoma of colon   ? ? ?Past Surgical History:  ?Procedure Laterality Date  ? APPENDECTOMY    ? CHOLECYSTECTOMY    ? partial  ? HERNIA REPAIR  2005, 2010  ? kidney stone Right 10/01/2021  ? ? ?Family History  ?Problem Relation Age of Onset  ? Breast cancer Mother 43  ? Colon cancer Mother 62  ? Prostate cancer Brother   ? Pancreatic cancer Maternal Grandmother   ? Rectal cancer Neg Hx   ? Esophageal cancer Neg Hx   ? Stomach cancer Neg Hx   ? ? ?Social History  ? ?Tobacco Use  ? Smoking status: Never  ? Smokeless tobacco: Never  ?Vaping Use  ? Vaping Use: Never used  ?Substance Use Topics  ? Alcohol use: No  ? Drug use: No  ?  ? ?No Known Allergies ? ?Review of Systems ?NEGATIVE UNLESS OTHERWISE INDICATED IN HPI ? ? ?   ?Objective:  ?  ? ?BP 119/81   Pulse 81   Temp 98 ?F (36.7 ?C)   Ht 5' 5.5" (1.664 m)   Wt 229 lb (103.9 kg)   SpO2 96%   BMI 37.53 kg/m?  ? ?Wt Readings from Last 3 Encounters:  ?11/17/21  229 lb (103.9 kg)  ?09/27/21 236 lb (107 kg)  ?05/13/21 237 lb 9.6 oz (107.8 kg)  ? ? ?BP Readings from Last 3 Encounters:  ?11/17/21 119/81  ?09/27/21 102/72  ?05/13/21 123/88  ?  ? ?Physical Exam ?Vitals reviewed.  ?Constitutional:   ?   Appearance: Normal appearance. She is obese.  ?HENT:  ?   Head: Normocephalic and atraumatic.  ?Cardiovascular:  ?   Rate and Rhythm: Rhythm irregular.  ?   Heart sounds: Normal heart sounds.  ?Pulmonary:  ?   Effort: Pulmonary effort is normal.  ?   Breath sounds: Normal breath sounds.  ?Abdominal:  ?   General: Bowel sounds are normal.  ?   Palpations: Abdomen is soft.  ?   Tenderness: There is no abdominal tenderness. There is no right CVA tenderness, left CVA tenderness or guarding.  ?Lymphadenopathy:  ?   Upper Body:  ?   Left upper body: Supraclavicular adenopathy (fullness ?mass, much more noticeable than right side) present.  ?Neurological:  ?   General: No focal deficit present.  ?   Mental Status: She is alert and oriented to person, place, and time.  ?  Psychiatric:     ?   Mood and Affect: Mood normal.     ?   Behavior: Behavior normal.  ? ? ?   ?Assessment & Plan:  ? ?Problem List Items Addressed This Visit   ? ?  ? Cardiovascular and Mediastinum  ? Atrial fibrillation ([place])  ? Relevant Orders  ? CBC with Differential/Platelet (Completed)  ? Comprehensive metabolic panel (Completed)  ? Lipid panel (Completed)  ?  ? Genitourinary  ? Chronic kidney disease (CKD), stage III (moderate) (HCC)  ? Relevant Orders  ? Comprehensive metabolic panel (Completed)  ?  ? Other  ? Mixed hyperlipidemia  ? Relevant Orders  ? Lipid panel (Completed)  ? ?Other Visit Diagnoses   ? ? Dark brown-colored urine    -  Primary  ? Other iron deficiency anemia      ? Relevant Orders  ? CBC with Differential/Platelet (Completed)  ? Iron, TIBC and Ferritin Panel (Completed)  ? Supraclavicular mass      ? Relevant Orders  ? US Soft Tissue Head/Neck (NON-THYROID)  ? Hyperglycemia      ? Relevant Orders   ? Comprehensive metabolic panel (Completed)  ? Hemoglobin A1c (Completed)  ? ?  ? ? ?1. Dark brown-colored urine ?This issue has since resolved since her cystoscopy procedure.  Secondary to large stone. ? ?2. Stage 3 chronic kidney disease, unspecified whether stage 3a or 3b CKD ([place]) ?Need to recheck CMP.  This had been improving.  Reminded to avoid all NSAIDs. ? ?3. Other iron deficiency anemia ?Recheck labs today.  Most likely secondary to renal stone.  She does have follow-up planned with GI to follow through with her screening colonoscopy soon as well though. ? ?4. Supraclavicular mass ?Noted on left supraclavicular area.  We think this may be new as it has not been noted before.  Plan for ultrasound of the area. ? ?5. Mixed hyperlipidemia ?Fasting lipid panel today.  Continue Crestor 5 mg. ? ?6. Hyperglycemia ?Check labs today.  Continue to work towards health goals. ? ?7. Persistent atrial fibrillation ([place]) ?Rate controlled.  Chronic A-fib.  Labs.  Continue Xarelto 15 mg daily. ? ? ?This note was prepared with assistance of Systems analyst. Occasional wrong-word or sound-a-like substitutions may have occurred due to the inherent limitations of voice recognition software. ? ? ?Plan for follow-up in 6 months for fasting labs and recheck or as needed. ? ? ?Tanav Orsak M Rutilio Yellowhair, PA-C ?

## 2021-11-17 NOTE — Patient Instructions (Signed)
Good to see you today! ?Please go to the lab for blood work and I will send results through [place]. ?F/up with Dr. Hilarie Fredrickson for colonoscopy this year. ? ?I have sent order for ultrasound to investigate left clavicular fullness - you should be contacted to schedule. ? ?Continue to work on lifestyle goals. ? ?

## 2021-11-18 LAB — IRON,TIBC AND FERRITIN PANEL
%SAT: 10 % (calc) — ABNORMAL LOW (ref 16–45)
Ferritin: 6 ng/mL — ABNORMAL LOW (ref 16–288)
Iron: 43 ug/dL — ABNORMAL LOW (ref 45–160)
TIBC: 434 mcg/dL (calc) (ref 250–450)

## 2021-12-11 ENCOUNTER — Other Ambulatory Visit: Payer: Self-pay | Admitting: Physician Assistant

## 2021-12-11 DIAGNOSIS — I10 Essential (primary) hypertension: Secondary | ICD-10-CM

## 2021-12-11 DIAGNOSIS — F341 Dysthymic disorder: Secondary | ICD-10-CM

## 2021-12-20 ENCOUNTER — Other Ambulatory Visit: Payer: Self-pay | Admitting: Physician Assistant

## 2021-12-20 DIAGNOSIS — F419 Anxiety disorder, unspecified: Secondary | ICD-10-CM

## 2021-12-20 NOTE — Telephone Encounter (Signed)
Last refill: 09/23/21 #90, 0 ?Last OV: 11/17/21 dx. CKD, HDL, Atrial Fibrillation  ?

## 2021-12-21 ENCOUNTER — Ambulatory Visit
Admission: RE | Admit: 2021-12-21 | Discharge: 2021-12-21 | Disposition: A | Discharge status: Home / Self Care | Payer: Medicare Other | Source: Ambulatory Visit | Attending: Physician Assistant | Admitting: Physician Assistant

## 2021-12-21 DIAGNOSIS — R221 Localized swelling, mass and lump, neck: Secondary | ICD-10-CM | POA: Diagnosis not present

## 2021-12-21 DIAGNOSIS — R222 Localized swelling, mass and lump, trunk: Secondary | ICD-10-CM

## 2021-12-27 ENCOUNTER — Telehealth: Payer: Self-pay

## 2021-12-27 NOTE — Telephone Encounter (Signed)
..   Encourage patient to contact the pharmacy for refills or they can request refills through [place]:  11/17/2021  NEXT APPOINTMENT DATE: 05/25/2022  MEDICATION:  Nebivolol   Is the patient out of medication?   PHARMACY:  [facility] [address]   Let patient know to contact pharmacy at the end of the day to make sure medication is ready.  Please notify patient to allow 48-72 hours to process  CLINICAL FILLS OUT ALL BELOW:   LAST REFILL:  QTY:  REFILL DATE:    OTHER COMMENTS:    Okay for refill?  Please advise

## 2021-12-28 ENCOUNTER — Other Ambulatory Visit: Payer: Self-pay

## 2021-12-28 DIAGNOSIS — I10 Essential (primary) hypertension: Secondary | ICD-10-CM

## 2021-12-28 MED ORDER — NEBIVOLOL HCL 20 MG PO TABS: 1.0000 | ORAL_TABLET | Freq: Every day | ORAL | 1 refills | Status: DC

## 2021-12-28 NOTE — Telephone Encounter (Signed)
Sent to pharmacy 

## 2022-02-18 IMAGING — MG DIGITAL SCREENING BILAT W/ TOMO W/ CAD
6 of 10 series · 6 of 30 positions shown · non-contrast
Comparison: Previous exam(s).

CLINICAL DATA: Screening.

EXAM:
DIGITAL SCREENING BILATERAL MAMMOGRAM WITH TOMO AND CAD

[L MLO synth-2D]
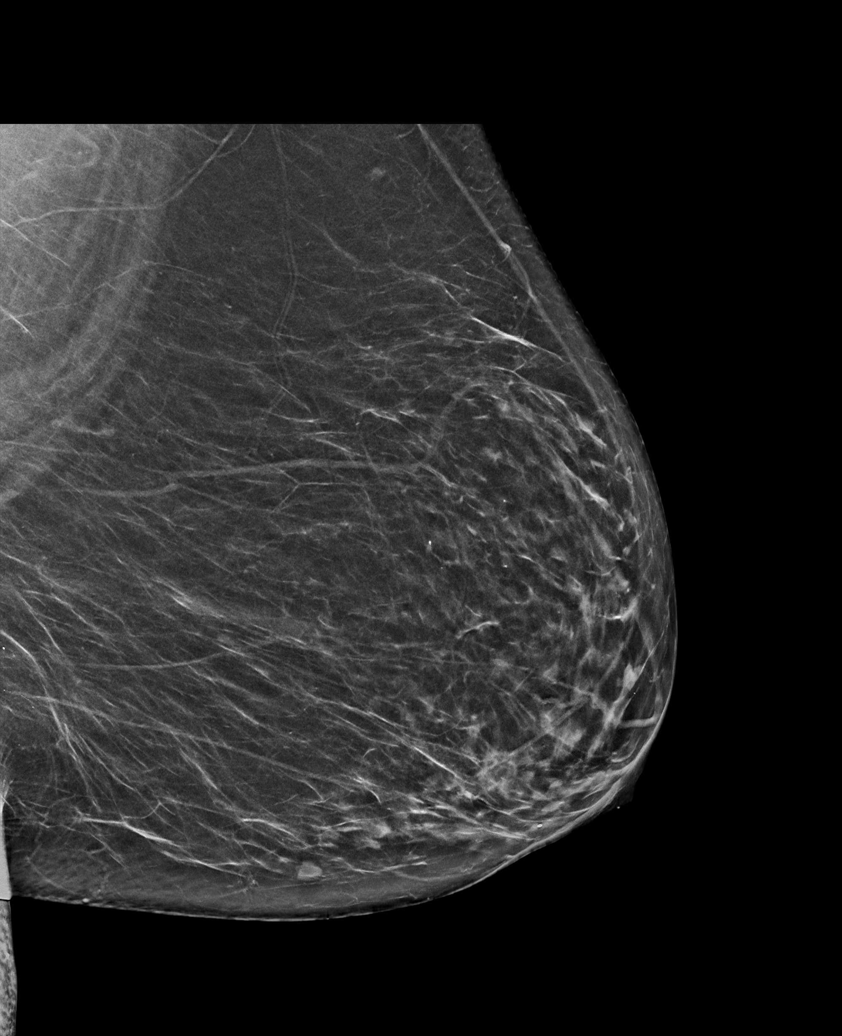

[R MLO synth-2D]
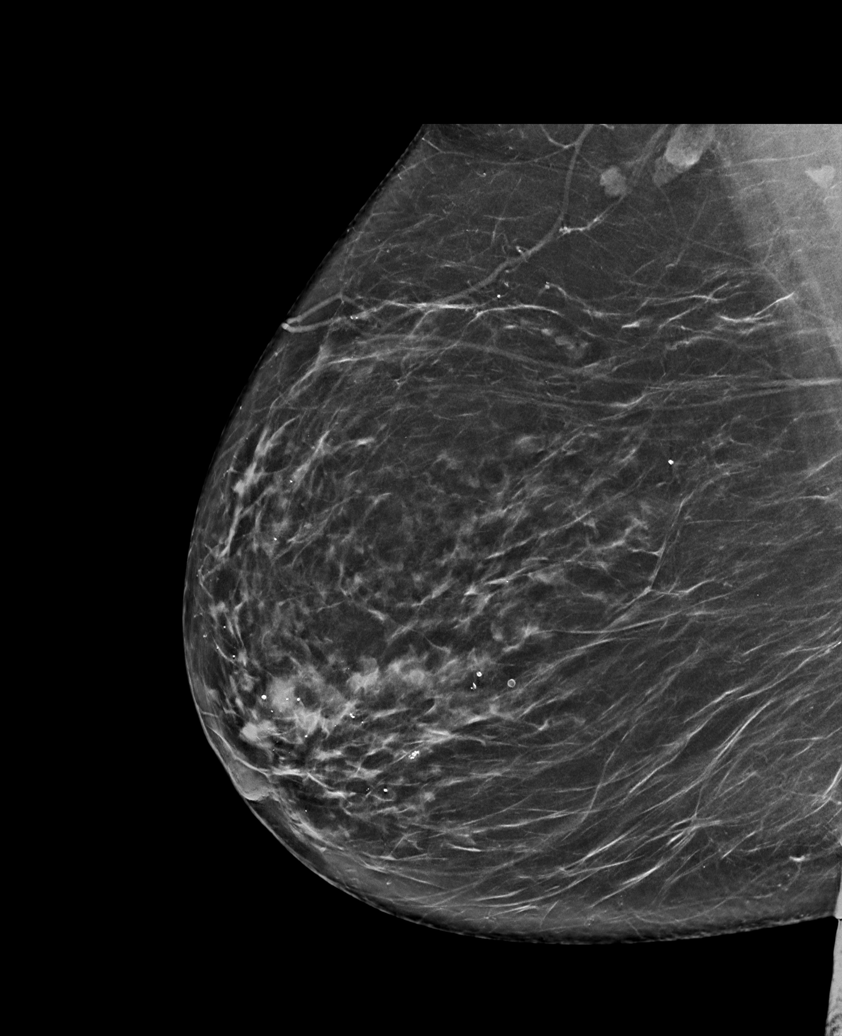

[L CC synth-2D (1 of 2)]
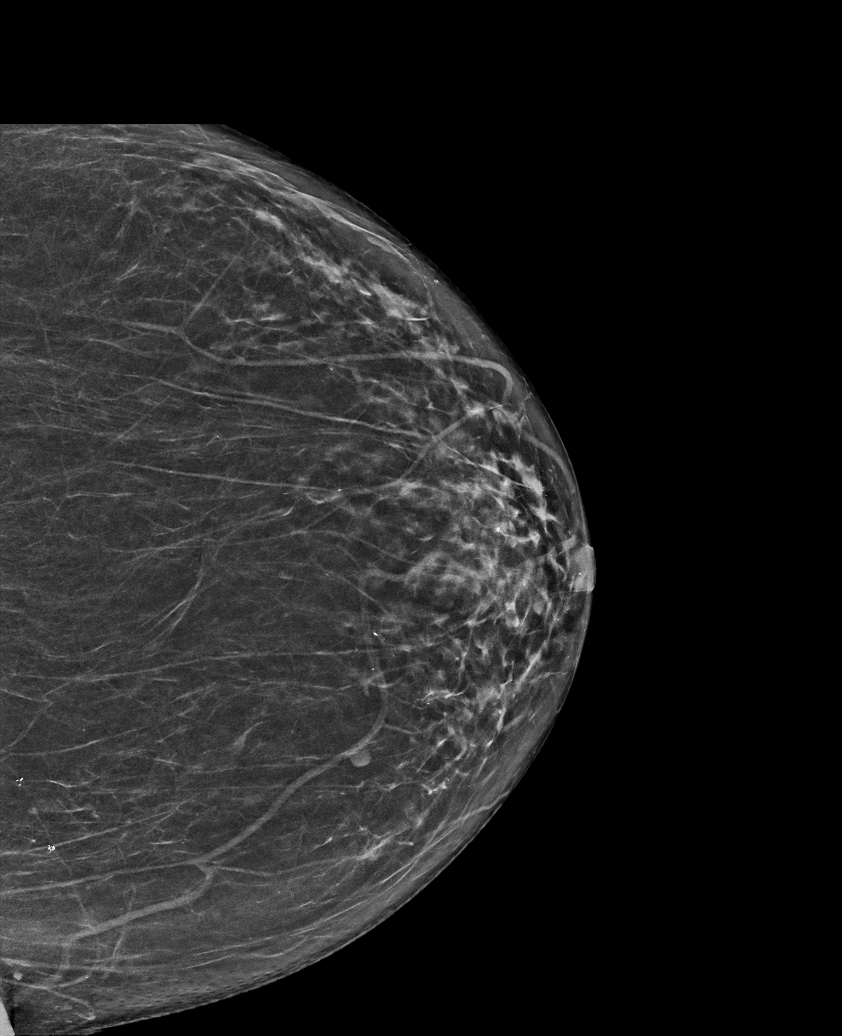

[R CC synth-2D]
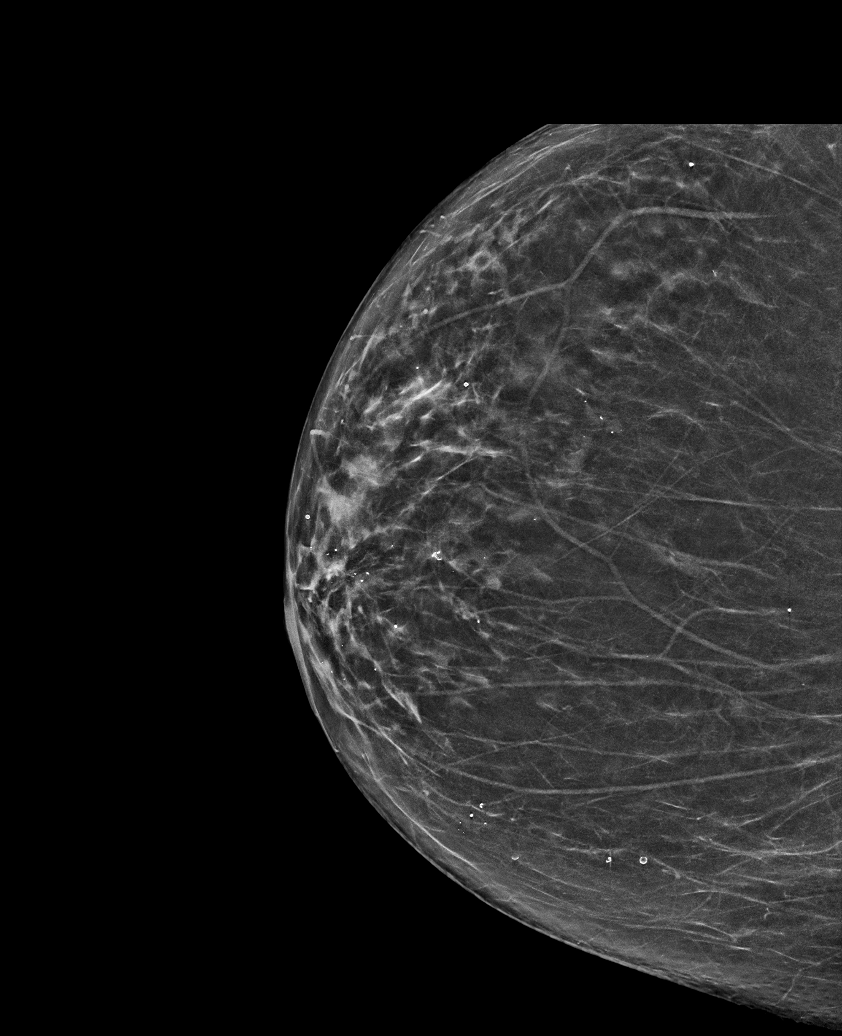

[L CC synth-2D (2 of 2)]
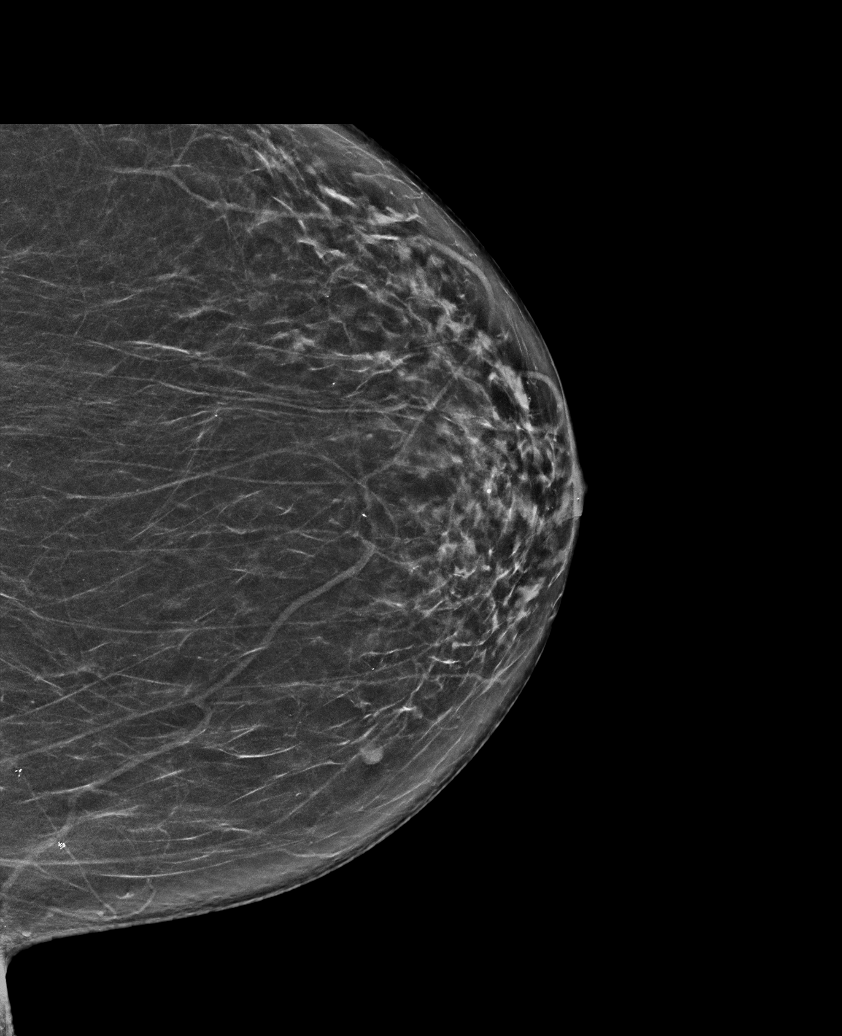

[L CC tomo · tomo slice 33/66.0]
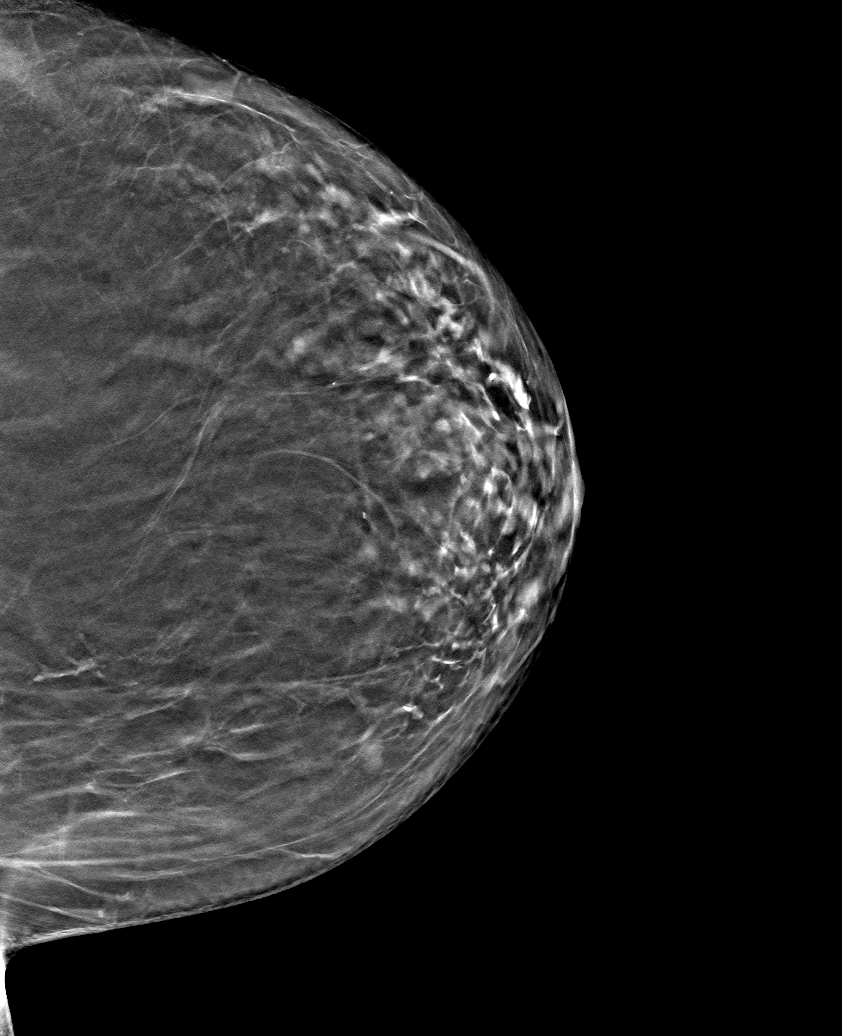

[6 of 30 positions shown; findings below may reference images not displayed]

ACR Breast Density Category b: There are scattered areas of
fibroglandular density.
FINDINGS: There are no findings suspicious for malignancy. Images were
processed with CAD.
IMPRESSION: No mammographic evidence of malignancy. A result letter of this
screening mammogram will be mailed directly to the patient.

RECOMMENDATION:
Screening mammogram in one year. (Code:CN-U-775)

BI-RADS CATEGORY  1: Negative.

## 2022-03-01 ENCOUNTER — Other Ambulatory Visit: Payer: Self-pay | Admitting: Physician Assistant

## 2022-03-01 DIAGNOSIS — F341 Dysthymic disorder: Secondary | ICD-10-CM

## 2022-03-21 ENCOUNTER — Other Ambulatory Visit: Payer: Self-pay | Admitting: Physician Assistant

## 2022-03-21 DIAGNOSIS — F419 Anxiety disorder, unspecified: Secondary | ICD-10-CM

## 2022-03-21 NOTE — Telephone Encounter (Signed)
Last OV: 11/17/2021  Next OV: 05/25/2022  Last filled: 12/21/21  Quantity: 90 w/ 0 refills

## 2022-05-23 ENCOUNTER — Encounter: Payer: Self-pay | Admitting: *Deleted

## 2022-05-25 ENCOUNTER — Encounter: Payer: Self-pay | Admitting: Physician Assistant

## 2022-05-25 ENCOUNTER — Ambulatory Visit (INDEPENDENT_AMBULATORY_CARE_PROVIDER_SITE_OTHER): Payer: Medicare Other | Admitting: Physician Assistant

## 2022-05-25 VITALS — BP 116/82 | HR 66 | Temp 97.0°F | Ht 65.5 in | Wt 239.4 lb

## 2022-05-25 DIAGNOSIS — I10 Essential (primary) hypertension: Secondary | ICD-10-CM | POA: Diagnosis not present

## 2022-05-25 DIAGNOSIS — E782 Mixed hyperlipidemia: Secondary | ICD-10-CM | POA: Diagnosis not present

## 2022-05-25 DIAGNOSIS — Z87898 Personal history of other specified conditions: Secondary | ICD-10-CM

## 2022-05-25 DIAGNOSIS — Z23 Encounter for immunization: Secondary | ICD-10-CM | POA: Diagnosis not present

## 2022-05-25 DIAGNOSIS — N1831 Chronic kidney disease, stage 3a: Secondary | ICD-10-CM

## 2022-05-25 DIAGNOSIS — R29898 Other symptoms and signs involving the musculoskeletal system: Secondary | ICD-10-CM

## 2022-05-25 DIAGNOSIS — Z800 Family history of malignant neoplasm of digestive organs: Secondary | ICD-10-CM

## 2022-05-25 DIAGNOSIS — D508 Other iron deficiency anemias: Secondary | ICD-10-CM

## 2022-05-25 DIAGNOSIS — Z7901 Long term (current) use of anticoagulants: Secondary | ICD-10-CM | POA: Diagnosis not present

## 2022-05-25 DIAGNOSIS — F341 Dysthymic disorder: Secondary | ICD-10-CM

## 2022-05-25 DIAGNOSIS — D649 Anemia, unspecified: Secondary | ICD-10-CM | POA: Insufficient documentation

## 2022-05-25 LAB — CBC WITH DIFFERENTIAL/PLATELET
Basophils Absolute: 0 10*3/uL (ref 0.0–0.1)
Basophils Relative: 0.7 % (ref 0.0–3.0)
Eosinophils Absolute: 0.2 10*3/uL (ref 0.0–0.7)
Eosinophils Relative: 2.3 % (ref 0.0–5.0)
HCT: 38.5 % (ref 36.0–46.0)
Hemoglobin: 13.1 g/dL (ref 12.0–15.0)
Lymphocytes Relative: 27.2 % (ref 12.0–46.0)
Lymphs Abs: 2 10*3/uL (ref 0.7–4.0)
MCHC: 33.9 g/dL (ref 30.0–36.0)
MCV: 91.9 fl (ref 78.0–100.0)
Monocytes Absolute: 0.5 10*3/uL (ref 0.1–1.0)
Monocytes Relative: 6.6 % (ref 3.0–12.0)
Neutro Abs: 4.7 10*3/uL (ref 1.4–7.7)
Neutrophils Relative %: 63.2 % (ref 43.0–77.0)
Platelets: 285 10*3/uL (ref 150.0–400.0)
RBC: 4.19 Mil/uL (ref 3.87–5.11)
RDW: 14.9 % (ref 11.5–15.5)
WBC: 7.5 10*3/uL (ref 4.0–10.5)

## 2022-05-25 LAB — COMPREHENSIVE METABOLIC PANEL
ALT: 10 U/L (ref 0–35)
AST: 18 U/L (ref 0–37)
Albumin: 4 g/dL (ref 3.5–5.2)
Alkaline Phosphatase: 59 U/L (ref 39–117)
BUN: 20 mg/dL (ref 6–23)
CO2: 32 mEq/L (ref 19–32)
Calcium: 9.8 mg/dL (ref 8.4–10.5)
Chloride: 101 mEq/L (ref 96–112)
Creatinine, Ser: 1.3 mg/dL — ABNORMAL HIGH (ref 0.40–1.20)
GFR: 41.49 mL/min — ABNORMAL LOW (ref >60.00)
Glucose, Bld: 95 mg/dL (ref 70–99)
Potassium: 4.3 mEq/L (ref 3.5–5.1)
Sodium: 141 mEq/L (ref 135–145)
Total Bilirubin: 0.7 mg/dL (ref 0.2–1.2)
Total Protein: 7.6 g/dL (ref 6.0–8.3)

## 2022-05-25 LAB — LIPID PANEL
Cholesterol: 128 mg/dL (ref 0–200)
HDL: 70.3 mg/dL (ref >39.00)
LDL Cholesterol: 42 mg/dL (ref 0–99)
NonHDL: 57.94
Total CHOL/HDL Ratio: 2
Triglycerides: 82 mg/dL (ref 0.0–149.0)
VLDL: 16.4 mg/dL (ref 0.0–40.0)

## 2022-05-25 LAB — IBC + FERRITIN
Ferritin: 14.3 ng/mL (ref 10.0–291.0)
Iron: 93 ug/dL (ref 42–145)
Saturation Ratios: 25 % (ref 20.0–50.0)
TIBC: 372.4 ug/dL (ref 250.0–450.0)
Transferrin: 266 mg/dL (ref 212.0–360.0)

## 2022-05-25 NOTE — Patient Instructions (Signed)
I'm glad to see you today! Please have labs checked. Call for med refills.  I would really like you to consider seeing orthopedics about your legs. Would love to see you able to ambulate more! Referral sent, think about it, please.

## 2022-05-25 NOTE — Progress Notes (Unsigned)
Subjective:    Patient ID: Jordan Ware, female    DOB: 01-02-1951, 71 y.o.   MRN: [id number]  Chief Complaint  Patient presents with   Follow-up    Pt in for 6 mon f/u; pt is fasting for labs if needed; pt requesting flu shot; needs referral for Colonoscopy,     HPI Patient is in today for regular 6 month f/up. Denies any major medical changes since last visit.   Past Medical History:  Diagnosis Date   Anxiety 06/05/2017   Atrial fibrillation ([place])    Atypical mole 10/21/2020   Left Lower Arm (Moderate - Dr. Orma Flaming) [facility]   Diverticulosis    Dysthymia 06/05/2017   Gallstones 1998   HLD (hyperlipidemia) 06/05/2017   Hypertension    Internal hemorrhoids    Tubular adenoma of colon     Past Surgical History:  Procedure Laterality Date   APPENDECTOMY     CHOLECYSTECTOMY     partial   HERNIA REPAIR  2005, 2010   kidney stone Right 10/01/2021    Family History  Problem Relation Age of Onset   Breast cancer Mother 90   Colon cancer Mother 19   Prostate cancer Brother    Pancreatic cancer Maternal Grandmother    Rectal cancer Neg Hx    Esophageal cancer Neg Hx    Stomach cancer Neg Hx     Social History   Tobacco Use   Smoking status: Never   Smokeless tobacco: Never  Vaping Use   Vaping Use: Never used  Substance Use Topics   Alcohol use: No   Drug use: No     No Known Allergies  Review of Systems NEGATIVE UNLESS OTHERWISE INDICATED IN HPI      Objective:     BP 116/82 (BP Location: Left Arm)   Pulse 66   Temp (!) 97 F (36.1 C) (Temporal)   Ht 5' 5.5" (1.664 m)   Wt 239 lb 6.4 oz (108.6 kg)   SpO2 94%   BMI 39.23 kg/m   Wt Readings from Last 3 Encounters:  05/25/22 239 lb 6.4 oz (108.6 kg)  11/17/21 229 lb (103.9 kg)  09/27/21 236 lb (107 kg)    BP Readings from Last 3 Encounters:  05/25/22 116/82  11/17/21 119/81  09/27/21 102/72     Physical Exam Vitals and nursing note reviewed.  Constitutional:       Appearance: Normal appearance. She is normal weight. She is not toxic-appearing.  HENT:     Head: Normocephalic and atraumatic.  Eyes:     Extraocular Movements: Extraocular movements intact.     Conjunctiva/sclera: Conjunctivae normal.     Pupils: Pupils are equal, round, and reactive to light.  Cardiovascular:     Rate and Rhythm: Normal rate. Rhythm irregular.     Pulses: Normal pulses.     Heart sounds: Normal heart sounds.  Pulmonary:     Effort: Pulmonary effort is normal.     Breath sounds: Normal breath sounds.  Musculoskeletal:        General: Normal range of motion.     Cervical back: Normal range of motion and neck supple.  Skin:    General: Skin is warm and dry.  Neurological:     General: No focal deficit present.     Mental Status: She is alert and oriented to person, place, and time.  Psychiatric:        Behavior: Behavior normal.  Assessment & Plan:  Essential hypertension Assessment & Plan: Blood pressure stable and to goal.  She will continue on nebivolol 20 mg once daily.  Monitor at home.  Continue working on lifestyle changes.   Morbid obesity ([place]), BMI > 35 with comorbid conditions Assessment & Plan: Lifestyle changes encouraged.  Exercise limited due to pain in legs.  Orders: -     Comprehensive metabolic panel  Chronic anticoagulation Assessment & Plan: Patient is on Xarelto 15 mg 2/2 A-fib.    Orders: -     CBC with Differential/Platelet  Stage 3a chronic kidney disease ([place]) Assessment & Plan: [place] labs today.  Needs to avoid NSAIDs.  Orders: -     Comprehensive metabolic panel  Mixed hyperlipidemia Assessment & Plan: Patient currently taking Crestor 5 mg once daily.  Recheck lipid panel today.  Orders: -     Lipid panel  Other iron deficiency anemia Assessment & Plan: Plan to recheck labs.  Orders: -     CBC with Differential/Platelet -     IBC + Ferritin  Dysthymia Assessment & Plan: Stable on Lexapro 20  mg.   Family history of colon cancer in mother Assessment & Plan: Patient is due for colonoscopy.  Sent referral at this time.  Orders: -     Ambulatory referral to Gastroenterology  Need for immunization against influenza -     Flu Vaccine QUAD High Dose(Fluad)  Leg weakness, bilateral -     Ambulatory referral to Orthopedics  History of pain when walking Assessment & Plan: Patient last had this issue addressed in 2019.  She went to see vascular and everything was normal, she has not had any follow-up since then after it was suggested to her that she follow-up with orthopedics.  Pain is quite significant.  She is only able to walk out to her mailbox and back.  She cannot go to a grocery store.  States that she has to stop often when she is walking because of the pain.  Patient very hesitant to look into etiology any further.  I strongly recommended that she get in with orthopedics and I did send a referral, really asking her to consider that.  Orders: -     Ambulatory referral to Orthopedics     Return in about 6 months (around 11/23/2022) for recheck .  This note was prepared with assistance of Systems analyst. Occasional wrong-word or sound-a-like substitutions may have occurred due to the inherent limitations of voice recognition software.     Bexleigh Theriault M Anuel Sitter, PA-C

## 2022-05-26 DIAGNOSIS — Z87898 Personal history of other specified conditions: Secondary | ICD-10-CM | POA: Insufficient documentation

## 2022-05-26 NOTE — Assessment & Plan Note (Addendum)
Patient last had this issue addressed in 2019.  She went to see vascular and everything was normal, she has not had any follow-up since then after it was suggested to her that she follow-up with orthopedics.  Pain is quite significant.  She is only able to walk out to her mailbox and back.  She cannot go to a grocery store.  States that she has to stop often when she is walking because of the pain.  Patient very hesitant to look into etiology any further.  I strongly recommended that she get in with orthopedics and I did send a referral, really asking her to consider that.

## 2022-05-26 NOTE — Assessment & Plan Note (Signed)
Blood pressure stable and to goal.  She will continue on nebivolol 20 mg once daily.  Monitor at home.  Continue working on lifestyle changes.

## 2022-05-26 NOTE — Assessment & Plan Note (Signed)
Plan to recheck labs.

## 2022-05-26 NOTE — Assessment & Plan Note (Signed)
Stable on Lexapro 20 mg.

## 2022-05-26 NOTE — Assessment & Plan Note (Signed)
Patient currently taking Crestor 5 mg once daily.  Recheck lipid panel today.

## 2022-05-26 NOTE — Assessment & Plan Note (Signed)
Recheck labs today.  Needs to avoid NSAIDs.

## 2022-05-26 NOTE — Assessment & Plan Note (Signed)
Patient is on Xarelto 15 mg 2/2 A-fib.

## 2022-05-26 NOTE — Assessment & Plan Note (Signed)
Patient is due for colonoscopy.  Sent referral at this time.

## 2022-05-26 NOTE — Assessment & Plan Note (Signed)
Lifestyle changes encouraged.  Exercise limited due to pain in legs.

## 2022-06-09 ENCOUNTER — Encounter (HOSPITAL_BASED_OUTPATIENT_CLINIC_OR_DEPARTMENT_OTHER): Payer: Self-pay

## 2022-06-09 ENCOUNTER — Encounter: Payer: Self-pay | Admitting: Physician Assistant

## 2022-06-09 DIAGNOSIS — Z23 Encounter for immunization: Secondary | ICD-10-CM | POA: Diagnosis not present

## 2022-06-19 ENCOUNTER — Other Ambulatory Visit: Payer: Self-pay | Admitting: Physician Assistant

## 2022-06-19 DIAGNOSIS — F419 Anxiety disorder, unspecified: Secondary | ICD-10-CM

## 2022-06-20 NOTE — Telephone Encounter (Signed)
Last OV: 05/25/22  Next OV: 11/10/22  Last filled: 03/21/22  Quantity: 90

## 2022-07-05 ENCOUNTER — Other Ambulatory Visit: Payer: Self-pay | Admitting: Physician Assistant

## 2022-07-05 DIAGNOSIS — I10 Essential (primary) hypertension: Secondary | ICD-10-CM

## 2022-07-15 ENCOUNTER — Other Ambulatory Visit: Payer: Self-pay | Admitting: Gastroenterology

## 2022-07-15 ENCOUNTER — Telehealth: Payer: Self-pay | Admitting: *Deleted

## 2022-07-15 ENCOUNTER — Encounter: Payer: Self-pay | Admitting: Gastroenterology

## 2022-07-15 ENCOUNTER — Ambulatory Visit (INDEPENDENT_AMBULATORY_CARE_PROVIDER_SITE_OTHER): Payer: Medicare Other | Admitting: Gastroenterology

## 2022-07-15 VITALS — BP 110/64 | HR 81 | Ht 65.0 in | Wt 237.4 lb

## 2022-07-15 DIAGNOSIS — Z86010 Personal history of colonic polyps: Secondary | ICD-10-CM | POA: Diagnosis not present

## 2022-07-15 DIAGNOSIS — Z7901 Long term (current) use of anticoagulants: Secondary | ICD-10-CM | POA: Diagnosis not present

## 2022-07-15 MED ORDER — PLENVU 140 G PO SOLR: 1.0000 | ORAL | 0 refills | Status: DC

## 2022-07-15 NOTE — Progress Notes (Signed)
07/15/2022 Tashawnda Bleiler [id number] 05-12-1951   HISTORY OF PRESENT ILLNESS:  Karlita Lichtman is a 71 year old female with a history of multiple adenomatous and sessile serrated polyps, diverticulosis, small hemorrhoids, iron deficiency anemia felt secondary to urinary blood loss related to kidney stone, hyperlipidemia, atrial fibrillation on Xarelto who is seen to discuss surveillance colonoscopy.  She is here alone today.  She is known to Dr. Hilarie Fredrickson from her last colonoscopy performed on 04/18/2018.  On this exam 9 polyps were removed all less than a centimeter.  5 of these polyps were adenomatous and one was sessile serrated polyp.  No high-grade dysplasia or malignancy.  There was diverticulosis in the left colon and small internal hemorrhoids.   She reports from a GI perspective she is doing well without complaints.  She was due for colonoscopy last year but wanted to post-pone it until she had her renal stone issues taken care of, which took several months.  Her Hgb is now back to normal.  He rectal bleeding.   She does take Xarelto for her atrial fibrillation that is prescribed by her PCP.   Past Medical History:  Diagnosis Date   Anxiety 06/05/2017   Atrial fibrillation ([place])    Atypical mole 10/21/2020   Left Lower Arm (Moderate - Dr. Orma Flaming) [facility]   Diverticulosis    Dysthymia 06/05/2017   Gallstones 1998   HLD (hyperlipidemia) 06/05/2017   Hypertension    Internal hemorrhoids    Tubular adenoma of colon    Past Surgical History:  Procedure Laterality Date   APPENDECTOMY     CHOLECYSTECTOMY     partial   HERNIA REPAIR  2005, 2010   kidney stone Right 10/01/2021    reports that she has never smoked. She has never used smokeless tobacco. She reports that she does not drink alcohol and does not use drugs. family history includes Breast cancer (age of onset: 55) in her mother; Colon cancer (age of onset: 13) in her mother; Pancreatic cancer in  her maternal grandmother; Prostate cancer in her brother. No Known Allergies    Outpatient Encounter Medications as of 07/15/2022  Medication Sig   ALPRAZolam (XANAX) 0.5 MG tablet TAKE 1 TABLET BY MOUTH AT BEDTIME AS NEEDED FOR ANXIETY   ASHWAGANDHA-RHODIOLA PO Take 1 tablet by mouth daily.   escitalopram (LEXAPRO) 20 MG tablet TAKE 1 TABLET BY MOUTH EVERY DAY   Flaxseed, Linseed, (FLAXSEED OIL) 1200 MG CAPS Take 1 capsule by mouth daily.   Melatonin 5 MG CAPS Take 1 capsule by mouth at bedtime.   Multiple Vitamin (MULTI-VITAMIN DAILY PO) Take by mouth daily.   Nebivolol HCl 20 MG TABS TAKE 1 TABLET BY MOUTH EVERY DAY   rosuvastatin (CRESTOR) 5 MG tablet TAKE 1 TABLET BY MOUTH EVERY DAY   XARELTO 15 MG TABS tablet TAKE 1 TABLET BY MOUTH EVERY DAY   No facility-administered encounter medications on file as of 07/15/2022.    REVIEW OF SYSTEMS  : All other systems reviewed and negative except where noted in the History of Present Illness.   PHYSICAL EXAM: BP 110/64   Pulse 81   Ht '5\' 5"'$  (1.651 m)   Wt 237 lb 6 oz (107.7 kg)   BMI 39.50 kg/m  General: Well developed white female in no acute distress Head: Normocephalic and atraumatic Eyes:  Sclerae anicteric, conjunctiva pink. Ears: Normal auditory acuity Lungs: Clear throughout to auscultation; no W/R/R. Heart: Regular rate and rhythm; no M/R/G. Rectal:  Will  be done at the time of colonoscopy. Musculoskeletal: Symmetrical with no gross deformities  Skin: No lesions on visible extremities Neurological: Alert oriented x 4, grossly non-focal Psychological:  Alert and cooperative. Normal mood and affect  ASSESSMENT AND PLAN: 71 year old female with a history of multiple adenomatous and sessile serrated polyps, diverticulosis, small hemorrhoids, recent iron deficiency anemia felt secondary to urinary blood loss related to kidney stone, hyperlipidemia, atrial fibrillation on Xarelto who is seen to discuss surveillance colonoscopy.      History of multiple adenomatous colon polyps --surveillance colonoscopy is recommended at this time, was due last year but she had issues with kidney stones that took several months to resolve.  Colonoscopy in the [place] with Dr. Hilarie Fredrickson. 2.  Chronic anticoagulation with Xarelto for history atrial fibrillation:  Will hold Xarelto 2 days prior to endoscopic procedures - will instruct when and how to resume after procedure. Benefits and risks of procedure explained including risks of bleeding, perforation, infection, missed lesions, reactions to medications and possible need for hospitalization and surgery for complications. Additional rare but real risk of stroke or other vascular clotting events off Xarelto also explained and need to seek urgent help if any signs of these problems occur. Will communicate by phone or EMR with patient's prescribing provider to confirm that holding Xarelto is reasonable in this case.   CC:  Allwardt, Randa Evens, PA-C

## 2022-07-15 NOTE — Telephone Encounter (Signed)
   Jordan Ware Sep 29, 1950 [id number]  Dear Yetta Flock Allwardt, PA-C:  We have scheduled the above named patient for a colonoscopy procedure. Our records show that (s)he is on anticoagulation therapy.  Please advise as to whether the patient may come off their therapy of xarelto 2 days prior to their procedure which is scheduled for 09/09/22.  Please route your response to Dixon Boos, CMA.  Sincerely,    [facility]

## 2022-07-15 NOTE — Patient Instructions (Signed)
You have been scheduled for a colonoscopy. Please follow written instructions given to you at your visit today.  Please pick up your prep supplies at the pharmacy within the next 1-3 days. If you use inhalers (even only as needed), please bring them with you on the day of your procedure.  _______________________________________________________  If you are age 71 or older, your body mass index should be between 23-30. Your Body mass index is 39.5 kg/m. If this is out of the aforementioned range listed, please consider follow up with your Primary Care Provider.  If you are age 71 or younger, your body mass index should be between 19-25. Your Body mass index is 39.5 kg/m. If this is out of the aformentioned range listed, please consider follow up with your Primary Care Provider.   ________________________________________________________  The [facility] providers would like to encourage you to use [facility] to communicate with providers for non-urgent requests or questions.  Due to long hold times on the telephone, sending your provider a message by [facility] may be a faster and more efficient way to get a response.  Please allow 48 business hours for a response.  Please remember that this is for non-urgent requests.  _______________________________________________________  ,Due to recent changes in healthcare laws, you may see the results of your imaging and laboratory studies on MyChart before your provider has had a chance to review them.  We understand that in some cases there may be results that are confusing or concerning to you. Not all laboratory results come back in the same time frame and the provider may be waiting for multiple results in order to interpret others.  Please give [place] 48 hours in order for your provider to thoroughly review all the results before contacting the office for clarification of your results.

## 2022-07-16 NOTE — Progress Notes (Signed)
Addendum: Reviewed and agree with assessment and management plan. Tonika Eden M, MD  

## 2022-07-18 NOTE — Telephone Encounter (Signed)
I have spoken to patient to advise that Alyssa Allwardt, PA-C has authorized her to hold xarelto 2 days prior to her upcoming colonoscopy procedure.  She verbalizes understanding of this information.

## 2022-07-18 NOTE — Telephone Encounter (Signed)
===  View-only below this line=== ----- Message ----- From: Fredirick Lathe, PA-C Sent: 07/17/2022   4:27 PM EST To: Larina Bras, CMA  Yes, patient may come off Xarelto 2 days prior to procedure.  Thanks, Alyssa Allwardt, PA-C

## 2022-07-18 NOTE — Telephone Encounter (Signed)
Patient called back was advised to stop her Xarelto for two days prior to her procedure. Patient understood.

## 2022-07-18 NOTE — Telephone Encounter (Signed)
Left message for patient to call back  

## 2022-08-05 ENCOUNTER — Ambulatory Visit (HOSPITAL_BASED_OUTPATIENT_CLINIC_OR_DEPARTMENT_OTHER): Payer: Medicare Other | Admitting: Orthopaedic Surgery

## 2022-08-10 ENCOUNTER — Ambulatory Visit (HOSPITAL_BASED_OUTPATIENT_CLINIC_OR_DEPARTMENT_OTHER): Payer: Medicare Other | Admitting: Orthopaedic Surgery

## 2022-08-19 ENCOUNTER — Other Ambulatory Visit: Payer: Self-pay | Admitting: Physician Assistant

## 2022-08-19 DIAGNOSIS — I4819 Other persistent atrial fibrillation: Secondary | ICD-10-CM

## 2022-09-09 ENCOUNTER — Encounter: Payer: Self-pay | Admitting: Internal Medicine

## 2022-09-09 ENCOUNTER — Ambulatory Visit (AMBULATORY_SURGERY_CENTER): Payer: Medicare Other | Admitting: Internal Medicine

## 2022-09-09 VITALS — BP 114/68 | HR 79 | Temp 97.9°F | Resp 18

## 2022-09-09 DIAGNOSIS — I4891 Unspecified atrial fibrillation: Secondary | ICD-10-CM | POA: Diagnosis not present

## 2022-09-09 DIAGNOSIS — Z86010 Personal history of colonic polyps: Secondary | ICD-10-CM | POA: Diagnosis not present

## 2022-09-09 DIAGNOSIS — D120 Benign neoplasm of cecum: Secondary | ICD-10-CM

## 2022-09-09 DIAGNOSIS — Z09 Encounter for follow-up examination after completed treatment for conditions other than malignant neoplasm: Secondary | ICD-10-CM | POA: Diagnosis not present

## 2022-09-09 DIAGNOSIS — N183 Chronic kidney disease, stage 3 unspecified: Secondary | ICD-10-CM | POA: Diagnosis not present

## 2022-09-09 MED ORDER — SODIUM CHLORIDE 0.9 % IV SOLN: 500.0000 mL | Freq: Once | INTRAVENOUS | Status: AC

## 2022-09-09 NOTE — Progress Notes (Signed)
Called to room to assist during endoscopic procedure.  Patient ID and intended procedure confirmed with present staff. Received instructions for my participation in the procedure from the performing physician.  

## 2022-09-09 NOTE — Patient Instructions (Signed)
Impression/Recommendations:  Polyp, diverticulosis, and hemorrhoid handouts given to patient.  Resume previous diet. Continue present medications. Await pathology results.  Resume Xarelto at previous dose tonight.  Repeat colonoscopy recommended for surveillance.  Date to be determined after pathology results reviewed.  YOU HAD AN ENDOSCOPIC PROCEDURE TODAY AT [facility]:   Refer to the procedure report that was given to you for any specific questions about what was found during the examination.  If the procedure report does not answer your questions, please call your gastroenterologist to clarify.  If you requested that your care partner not be given the details of your procedure findings, then the procedure report has been included in a sealed envelope for you to review at your convenience later.  YOU SHOULD EXPECT: Some feelings of bloating in the abdomen. Passage of more gas than usual.  Walking can help get rid of the air that was put into your GI tract during the procedure and reduce the bloating. If you had a lower endoscopy (such as a colonoscopy or flexible sigmoidoscopy) you may notice spotting of blood in your stool or on the toilet paper. If you underwent a bowel prep for your procedure, you may not have a normal bowel movement for a few days.  Please Note:  You might notice some irritation and congestion in your nose or some drainage.  This is from the oxygen used during your procedure.  There is no need for concern and it should clear up in a day or so.  SYMPTOMS TO REPORT IMMEDIATELY:  Following lower endoscopy (colonoscopy or flexible sigmoidoscopy):  Excessive amounts of blood in the stool  Significant tenderness or worsening of abdominal pains  Swelling of the abdomen that is new, acute  Fever of 100F or higher  For urgent or emergent issues, a gastroenterologist can be reached at any hour by calling [phone]. Do not use MyChart messaging for  urgent concerns.    DIET:  We do recommend a small meal at first, but then you may proceed to your regular diet.  Drink plenty of fluids but you should avoid alcoholic beverages for 24 hours.  ACTIVITY:  You should plan to take it easy for the rest of today and you should NOT DRIVE or use heavy machinery until tomorrow (because of the sedation medicines used during the test).    FOLLOW UP: Our staff will call the number listed on your records the next business day following your procedure.  We will call around 7:15- 8:00 am to check on you and address any questions or concerns that you may have regarding the information given to you following your procedure. If we do not reach you, we will leave a message.     If any biopsies were taken you will be contacted by phone or by letter within the next 1-3 weeks.  Please call us at [phone] if you have not heard about the biopsies in 3 weeks.    SIGNATURES/CONFIDENTIALITY: You and/or your care partner have signed paperwork which will be entered into your electronic medical record.  These signatures attest to the fact that that the information above on your After Visit Summary has been reviewed and is understood.  Full responsibility of the confidentiality of this discharge information lies with you and/or your care-partner.

## 2022-09-09 NOTE — Progress Notes (Signed)
GASTROENTEROLOGY PROCEDURE H&P NOTE   Primary Care Physician: Allwardt, Randa Evens, PA-C    Reason for Procedure:  History of colon polyps  Plan:    Surveillance colonoscopy  Patient is appropriate for endoscopic procedure(s) in the ambulatory ([place]) setting.  The nature of the procedure, as well as the risks, benefits, and alternatives were carefully and thoroughly reviewed with the patient. Ample time for discussion and questions allowed. The patient understood, was satisfied, and agreed to proceed.     HPI: Jordan Ware is a 72 y.o. female who presents for surveillance colonoscopy.  Medical history as below.  Tolerated the prep.  No recent chest pain or shortness of breath.  No abdominal pain today.  Xarelto on hold x 2 days  Past Medical History:  Diagnosis Date   Anxiety 06/05/2017   Atrial fibrillation ([place])    Atypical mole 10/21/2020   Left Lower Arm (Moderate - Dr. Orma Flaming) [facility]   Diverticulosis    Dysthymia 06/05/2017   Gallstones 1998   HLD (hyperlipidemia) 06/05/2017   Hypertension    Internal hemorrhoids    Tubular adenoma of colon     Past Surgical History:  Procedure Laterality Date   APPENDECTOMY     CHOLECYSTECTOMY     partial   HERNIA REPAIR  2005, 2010   kidney stone Right 10/01/2021    Prior to Admission medications   Medication Sig Start Date End Date Taking? Authorizing Provider  ALPRAZolam (XANAX) 0.5 MG tablet TAKE 1 TABLET BY MOUTH AT BEDTIME AS NEEDED FOR ANXIETY 06/20/22  Yes Allwardt, Alyssa M, PA-C  ASHWAGANDHA-RHODIOLA PO Take 1 tablet by mouth daily.   Yes [provider]  escitalopram (LEXAPRO) 20 MG tablet TAKE 1 TABLET BY MOUTH EVERY DAY 03/02/22  Yes Allwardt, Alyssa M, PA-C  Flaxseed, Linseed, (FLAXSEED OIL) 1200 MG CAPS Take 1 capsule by mouth daily.   Yes [provider]  Melatonin 5 MG CAPS Take 1 capsule by mouth at bedtime.   Yes [provider]  Multiple Vitamin (MULTI-VITAMIN  DAILY PO) Take by mouth daily.   Yes [provider]  Nebivolol HCl 20 MG TABS TAKE 1 TABLET BY MOUTH EVERY DAY 07/05/22  Yes Allwardt, Alyssa M, PA-C  rosuvastatin (CRESTOR) 5 MG tablet TAKE 1 TABLET BY MOUTH EVERY DAY 08/19/22  Yes Allwardt, Alyssa M, PA-C  XARELTO 15 MG TABS tablet TAKE 1 TABLET BY MOUTH EVERY DAY 08/19/22   Allwardt, Randa Evens, PA-C    Current Outpatient Medications  Medication Sig Dispense Refill   ALPRAZolam (XANAX) 0.5 MG tablet TAKE 1 TABLET BY MOUTH AT BEDTIME AS NEEDED FOR ANXIETY 90 tablet 0   ASHWAGANDHA-RHODIOLA PO Take 1 tablet by mouth daily.     escitalopram (LEXAPRO) 20 MG tablet TAKE 1 TABLET BY MOUTH EVERY DAY 90 tablet 1   Flaxseed, Linseed, (FLAXSEED OIL) 1200 MG CAPS Take 1 capsule by mouth daily.     Melatonin 5 MG CAPS Take 1 capsule by mouth at bedtime.     Multiple Vitamin (MULTI-VITAMIN DAILY PO) Take by mouth daily.     Nebivolol HCl 20 MG TABS TAKE 1 TABLET BY MOUTH EVERY DAY 90 tablet 1   rosuvastatin (CRESTOR) 5 MG tablet TAKE 1 TABLET BY MOUTH EVERY DAY 90 tablet 3   XARELTO 15 MG TABS tablet TAKE 1 TABLET BY MOUTH EVERY DAY 90 tablet 3   Current Facility-Administered Medications  Medication Dose Route Frequency Provider Last Rate Last Admin   0.9 %  sodium chloride infusion  500 mL Intravenous Once Nikitta Sobiech, Lajuan Lines, MD        Allergies as of 09/09/2022   (No Known Allergies)    Family History  Problem Relation Age of Onset   Breast cancer Mother 37   Colon cancer Mother 44   Prostate cancer Brother    Pancreatic cancer Maternal Grandmother    Rectal cancer Neg Hx    Esophageal cancer Neg Hx    Stomach cancer Neg Hx     Social History   Socioeconomic History   Marital status: Widowed    Spouse name: Not on file   Number of children: 1   Years of education: Not on file   Highest education level: Not on file  Occupational History   Occupation: Retired   Tobacco Use   Smoking status: Never   Smokeless tobacco: Never   Vaping Use   Vaping Use: Never used  Substance and Sexual Activity   Alcohol use: No   Drug use: No   Sexual activity: Yes    Partners: Male  Other Topics Concern   Not on file  Social History Narrative   Lives alone    1 daughter- 1 grandchild    Drives without difficulty    Social Determinants of Health   Financial Resource Strain: Low Risk  (10/28/2021)   Overall Financial Resource Strain (CARDIA)    Difficulty of Paying Living Expenses: Not hard at all  Food Insecurity: No Food Insecurity (10/28/2021)   Hunger Vital Sign    Worried About Running Out of Food in the Last Year: Never true    [place] in the Last Year: Never true  Transportation Needs: No Transportation Needs (10/28/2021)   PRAPARE - Hydrologist (Medical): No    Lack of Transportation (Non-Medical): No  Physical Activity: Inactive (10/28/2021)   Exercise Vital Sign    Days of Exercise per Week: 0 days    Minutes of Exercise per Session: 0 min  Stress: No Stress Concern Present (10/28/2021)   [place]    Feeling of Stress : Not at all  Social Connections: Socially Isolated (10/28/2021)   Social Connection and Isolation Panel [NHANES]    Frequency of Communication with Friends and Family: More than three times a week    Frequency of Social Gatherings with Friends and Family: Once a week    Attends Religious Services: Never    Marine scientist or Organizations: No    Attends Archivist Meetings: Never    Marital Status: Widowed  Intimate Partner Violence: Not At Risk (10/28/2021)   Humiliation, Afraid, Rape, and Kick questionnaire    Fear of Current or Ex-Partner: No    Emotionally Abused: No    Physically Abused: No    Sexually Abused: No    Physical Exam: Vital signs in last 24 hours: '@Temp'$  97.9 F (36.6 C)  GEN: NAD EYE: Sclerae anicteric ENT: MMM CV: Non-tachycardic Pulm: CTA b/l GI:  Soft, NT/ND NEURO:  Alert & Oriented x 3   Zenovia Jarred, MD [facility]  09/09/2022 2:07 PM

## 2022-09-09 NOTE — Op Note (Addendum)
[place] Patient Name: Jordan Ware Procedure Date: 09/09/2022 2:10 PM MRN: [id number] Endoscopist: Jerene Bears , MD, [id number] Age: 72 Referring MD:  Date of Birth: 1950/09/26 Gender: Female Account #: [id number] Procedure:                Colonoscopy Indications:              High risk colon cancer surveillance: Personal                            history of multiple adenomas x 5 and SSP x 1 at                            last colonoscopy: August 2019 Medicines:                Monitored Anesthesia Care Procedure:                Pre-Anesthesia Assessment:                           - Prior to the procedure, a History and Physical                            was performed, and patient medications and                            allergies were reviewed. The patient's tolerance of                            previous anesthesia was also reviewed. The risks                            and benefits of the procedure and the sedation                            options and risks were discussed with the patient.                            All questions were answered, and informed consent                            was obtained. Prior Anticoagulants: The patient has                            taken Xarelto (rivaroxaban), last dose was 2 days                            prior to procedure. ASA Grade Assessment: II - A                            patient with mild systemic disease. After reviewing                            the risks and benefits, the patient was deemed in  satisfactory condition to undergo the procedure.                           After obtaining informed consent, the colonoscope                            was passed under direct vision. Throughout the                            procedure, the patient's blood pressure, pulse, and                            oxygen saturations were monitored continuously. The                            CF [id number]  #[id number] was introduced through the anus                            and advanced to the cecum, identified by                            appendiceal orifice and ileocecal valve. The                            colonoscopy was performed without difficulty. The                            patient tolerated the procedure well. The quality                            of the bowel preparation was good. The ileocecal                            valve, appendiceal orifice, and rectum were                            photographed. Scope In: 2:14:28 PM Scope Out: 2:28:56 PM Scope Withdrawal Time: 0 hours 10 minutes 17 seconds  Total Procedure Duration: 0 hours 14 minutes 28 seconds  Findings:                 The digital rectal exam was normal.                           Three sessile polyps were found in the cecum. The                            polyps were 4 to 5 mm in size. These polyps were                            removed with a cold snare. Resection and retrieval                            were complete.  Multiple large-mouthed, medium-mouthed and                            small-mouthed diverticula were found in the sigmoid                            colon, descending colon, transverse colon and                            ascending colon.                           Internal hemorrhoids were found during                            retroflexion. The hemorrhoids were small. Complications:            No immediate complications. Estimated Blood Loss:     Estimated blood loss was minimal. Impression:               - Three 4 to 5 mm polyps in the cecum, removed with                            a cold snare. Resected and retrieved.                           - Moderate diverticulosis in the sigmoid colon, in                            the descending colon, in the transverse colon and                            in the ascending colon.                           - Small internal  hemorrhoids. Recommendation:           - Patient has a contact number available for                            emergencies. The signs and symptoms of potential                            delayed complications were discussed with the                            patient. Return to normal activities tomorrow.                            Written discharge instructions were provided to the                            patient.                           - Resume previous diet.                           -  Continue present medications.                           - Resume Xarelto (rivaroxaban) at prior dose today.                            Refer to managing physician for further adjustment                            of therapy.                           - Await pathology results.                           - Repeat colonoscopy is recommended for                            surveillance. The colonoscopy date will be                            determined after pathology results from today's                            exam become available for review. Jerene Bears, MD 09/09/2022 2:33:22 PM This report has been signed electronically.

## 2022-09-09 NOTE — Progress Notes (Signed)
Pt's states no medical or surgical changes since previsit or office visit. 

## 2022-09-09 NOTE — Progress Notes (Signed)
Report to PACU, RN, vss, BBS= Clear.  

## 2022-09-12 ENCOUNTER — Telehealth: Payer: Self-pay | Admitting: *Deleted

## 2022-09-12 NOTE — Telephone Encounter (Signed)
  Follow up Call-     09/09/2022    1:50 PM  Call back number  Post procedure Call Back phone  # [phone]  Permission to leave phone message Yes     Patient questions:  Do you have a fever, pain , or abdominal swelling? No. Pain Score  0 *  Have you tolerated food without any problems? Yes.    Have you been able to return to your normal activities? Yes.    Do you have any questions about your discharge instructions: Diet   No. Medications  No. Follow up visit  No.  Do you have questions or concerns about your Care? No.  Actions: * If pain score is 4 or above: No action needed, pain <4.

## 2022-09-15 ENCOUNTER — Other Ambulatory Visit: Payer: Self-pay | Admitting: Physician Assistant

## 2022-09-15 DIAGNOSIS — F341 Dysthymic disorder: Secondary | ICD-10-CM

## 2022-09-16 ENCOUNTER — Other Ambulatory Visit: Payer: Self-pay | Admitting: Physician Assistant

## 2022-09-16 DIAGNOSIS — F419 Anxiety disorder, unspecified: Secondary | ICD-10-CM

## 2022-09-16 NOTE — Telephone Encounter (Signed)
Last OV: 05/25/22  Next OV: 11/10/22  Last filled: 06/20/22  Quantity: 90 tablets

## 2022-09-19 ENCOUNTER — Encounter: Payer: Self-pay | Admitting: Internal Medicine

## 2022-09-23 ENCOUNTER — Ambulatory Visit (INDEPENDENT_AMBULATORY_CARE_PROVIDER_SITE_OTHER): Payer: Medicare Other | Admitting: Orthopaedic Surgery

## 2022-09-23 ENCOUNTER — Ambulatory Visit (INDEPENDENT_AMBULATORY_CARE_PROVIDER_SITE_OTHER): Payer: Medicare Other

## 2022-09-23 DIAGNOSIS — R29898 Other symptoms and signs involving the musculoskeletal system: Secondary | ICD-10-CM | POA: Diagnosis not present

## 2022-09-23 DIAGNOSIS — M170 Bilateral primary osteoarthritis of knee: Secondary | ICD-10-CM | POA: Diagnosis not present

## 2022-09-23 DIAGNOSIS — R531 Weakness: Secondary | ICD-10-CM | POA: Diagnosis not present

## 2022-09-23 NOTE — Progress Notes (Signed)
Chief Complaint: Weakness     History of Present Illness:    Jordan Ware is a 72 y.o. female has been experiencing pain and weakness of the lower extremity.  She states that this feels like a sciatic pain with the posterior buttock area being quite diffusely achy.  She states this been going on for many years.  She has been walking with a different gait involving the right leg.  She is currently retired.  She does live alone.    Surgical History:   none  PMH/PSH/Family History/Social History/Meds/Allergies:    Past Medical History:  Diagnosis Date   Anxiety 06/05/2017   Atrial fibrillation ([place])    Atypical mole 10/21/2020   Left Lower Arm (Moderate - Dr. Orma Flaming) [facility]   Diverticulosis    Dysthymia 06/05/2017   Gallstones 1998   HLD (hyperlipidemia) 06/05/2017   Hypertension    Internal hemorrhoids    Tubular adenoma of colon    Past Surgical History:  Procedure Laterality Date   APPENDECTOMY     CHOLECYSTECTOMY     partial   HERNIA REPAIR  2005, 2010   kidney stone Right 10/01/2021   Social History   Socioeconomic History   Marital status: Widowed    Spouse name: Not on file   Number of children: 1   Years of education: Not on file   Highest education level: Not on file  Occupational History   Occupation: Retired   Tobacco Use   Smoking status: Never   Smokeless tobacco: Never  Vaping Use   Vaping Use: Never used  Substance and Sexual Activity   Alcohol use: No   Drug use: No   Sexual activity: Yes    Partners: Male  Other Topics Concern   Not on file  Social History Narrative   Lives alone    1 daughter- 1 grandchild    Drives without difficulty    Social Determinants of Health   Financial Resource Strain: Low Risk  (10/28/2021)   Overall Financial Resource Strain (CARDIA)    Difficulty of Paying Living Expenses: Not hard at all  Food Insecurity: No Food Insecurity (10/28/2021)   Hunger  Vital Sign    Worried About Running Out of Food in the Last Year: Never true    [place] in the Last Year: Never true  Transportation Needs: No Transportation Needs (10/28/2021)   PRAPARE - Hydrologist (Medical): No    Lack of Transportation (Non-Medical): No  Physical Activity: Inactive (10/28/2021)   Exercise Vital Sign    Days of Exercise per Week: 0 days    Minutes of Exercise per Session: 0 min  Stress: No Stress Concern Present (10/28/2021)   [place]    Feeling of Stress : Not at all  Social Connections: Socially Isolated (10/28/2021)   Social Connection and Isolation Panel [NHANES]    Frequency of Communication with Friends and Family: More than three times a week    Frequency of Social Gatherings with Friends and Family: Once a week    Attends Religious Services: Never    Marine scientist or Organizations: No    Attends Archivist Meetings: Never    Marital Status: Widowed   Family History  Problem Relation Age of Onset   Breast cancer Mother 54   Colon cancer Mother 84   Prostate cancer Brother    Pancreatic cancer Maternal Grandmother    Rectal cancer Neg Hx    Esophageal cancer Neg Hx    Stomach cancer Neg Hx    No Known Allergies Current Outpatient Medications  Medication Sig Dispense Refill   ALPRAZolam (XANAX) 0.5 MG tablet TAKE 1 TABLET BY MOUTH AT BEDTIME AS NEEDED FOR ANXIETY 90 tablet 0   ASHWAGANDHA-RHODIOLA PO Take 1 tablet by mouth daily.     escitalopram (LEXAPRO) 20 MG tablet TAKE 1 TABLET BY MOUTH EVERY DAY 90 tablet 1   Flaxseed, Linseed, (FLAXSEED OIL) 1200 MG CAPS Take 1 capsule by mouth daily.     Melatonin 5 MG CAPS Take 1 capsule by mouth at bedtime.     Multiple Vitamin (MULTI-VITAMIN DAILY PO) Take by mouth daily.     Nebivolol HCl 20 MG TABS TAKE 1 TABLET BY MOUTH EVERY DAY 90 tablet 1   rosuvastatin (CRESTOR) 5 MG tablet TAKE 1  TABLET BY MOUTH EVERY DAY 90 tablet 3   XARELTO 15 MG TABS tablet TAKE 1 TABLET BY MOUTH EVERY DAY 90 tablet 3   Current Facility-Administered Medications  Medication Dose Route Frequency Provider Last Rate Last Admin   0.9 %  sodium chloride infusion  500 mL Intravenous Once Pyrtle, Lajuan Lines, MD       No results found.  Review of Systems:   A ROS was performed including pertinent positives and negatives as documented in the HPI.  Physical Exam :   Constitutional: NAD and appears stated age Neurological: Alert and oriented Psych: Appropriate affect and cooperative There were no vitals taken for this visit.   Comprehensive Musculoskeletal Exam:    She walks with a significant varus thrust.  There is minimal rotation about the right hip of approximately 10 degrees internal/external rotation with significant pain.  This does refer to the posterior buttocks.  Range of motion about the right knee is from 0 to 120 degrees with significant crepitus and tricompartmental pain.  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   Assessment:   72 y.o. female with quite significant osteoarthritis involving her right hip and right knee.  Overall I do believe the pain that she is attributing to sciatica has more buttock pain consistent with end-stage right hip osteoarthritis.  Side effect I do believe that she would benefit from treatment of this.  We did briefly discuss treatment options.  Given the severity of her illness I do believe that it would not be unreasonable to consider arthroplasty.  I did also discuss injections as an option although she really does cope quite well with her pain and the majority of her symptoms appear to be a result of her gait abnormality and limited motion about the hip.  Side effect we did discuss hip  arthroplasty.  She will consider her options and send [place] a MyChart with what she would like to proceed with.  Plan :    -She will send [place] a MyChart after further consideration     I personally saw and evaluated the patient, and participated in the management and treatment plan.  Vanetta Mulders, MD Attending Physician, Orthopedic Surgery  This document was dictated using Dragon voice recognition  software. A reasonable attempt at proof reading has been made to minimize errors. 

## 2022-11-10 ENCOUNTER — Ambulatory Visit (INDEPENDENT_AMBULATORY_CARE_PROVIDER_SITE_OTHER): Payer: Medicare Other

## 2022-11-10 VITALS — Wt 237.0 lb

## 2022-11-10 DIAGNOSIS — Z0000 Encounter for general adult medical examination without abnormal findings: Secondary | ICD-10-CM

## 2022-11-10 DIAGNOSIS — Z1231 Encounter for screening mammogram for malignant neoplasm of breast: Secondary | ICD-10-CM

## 2022-11-10 NOTE — Progress Notes (Addendum)
I connected with  Jenecia Winegar on 11/10/22 by a audio enabled telemedicine application and verified that I am speaking with the correct person using two identifiers.  Patient Location: Home  Provider Location: Office/Clinic     Patient Medicare AWV questionnaire was completed by the patient on 11/03/22; I have confirmed that all information answered by patient is correct and no changes since this date.    I discussed the limitations of evaluation and management by telemedicine. The patient expressed understanding and agreed to proceed.   Subjective:   Lace Cruey is a 72 y.o. female who presents for Medicare Annual (Subsequent) preventive examination.  Review of Systems     Cardiac Risk Factors include: advanced age (>75mn, >>71women);dyslipidemia;hypertension;obesity (BMI >30kg/m2)     Objective:    Today's Vitals   11/10/22 1332  Weight: 237 lb (107.5 kg)   Body mass index is 39.44 kg/m.     11/10/2022    1:35 PM 11/12/2019    3:18 PM 10/31/2018    3:48 PM  Advanced Directives  Does Patient Have a Medical Advance Directive? Yes Yes Yes  Type of AParamedicof [place]Living will [place] will Living will  Does patient want to make changes to medical advance directive?  No - Patient declined No - Patient declined  Copy of [place] Chart? No - copy requested No - copy requested     Current Medications (verified) Outpatient Encounter Medications as of 11/10/2022  Medication Sig   ALPRAZolam (XANAX) 0.5 MG tablet TAKE 1 TABLET BY MOUTH AT BEDTIME AS NEEDED FOR ANXIETY   ASHWAGANDHA-RHODIOLA PO Take 1 tablet by mouth daily.   escitalopram (LEXAPRO) 20 MG tablet TAKE 1 TABLET BY MOUTH EVERY DAY   Flaxseed, Linseed, (FLAXSEED OIL) 1200 MG CAPS Take 1 capsule by mouth daily.   Multiple Vitamin (MULTI-VITAMIN DAILY PO) Take by mouth daily.   Nebivolol HCl 20 MG TABS TAKE 1 TABLET BY  MOUTH EVERY DAY   rosuvastatin (CRESTOR) 5 MG tablet TAKE 1 TABLET BY MOUTH EVERY DAY   XARELTO 15 MG TABS tablet TAKE 1 TABLET BY MOUTH EVERY DAY   [DISCONTINUED] Melatonin 5 MG CAPS Take 1 capsule by mouth at bedtime.   Facility-Administered Encounter Medications as of 11/10/2022  Medication   0.9 %  sodium chloride infusion    Allergies (verified) Patient has no known allergies.   History: Past Medical History:  Diagnosis Date   Anxiety 06/05/2017   Atrial fibrillation (H[place]am    Atypical mole 10/21/2020   Left Lower Arm (Moderate - Dr. AOrma Flaming ([facility]  Diverticulosis    Dysthymia 06/05/2017   Gallstones 1998   HLD (hyperlipidemia) 06/05/2017   Hypertension    Internal hemorrhoids    Tubular adenoma of colon    Past Surgical History:  Procedure Laterality Date   APPENDECTOMY     CHOLECYSTECTOMY     partial   HERNIA REPAIR  2005, 2010   kidney stone Right 10/01/2021   Family History  Problem Relation Age of Onset   Breast cancer Mother 755  Colon cancer Mother 832  Prostate cancer Brother    Pancreatic cancer Maternal Grandmother    Rectal cancer Neg Hx    Esophageal cancer Neg Hx    Stomach cancer Neg Hx    Social History   Socioeconomic History   Marital status: Widowed    Spouse name: Not on file   Number of  children: 1   Years of education: Not on file   Highest education level: Not on file  Occupational History   Occupation: Retired   Tobacco Use   Smoking status: Never   Smokeless tobacco: Never  Vaping Use   Vaping Use: Never used  Substance and Sexual Activity   Alcohol use: No   Drug use: No   Sexual activity: Yes    Partners: Male  Other Topics Concern   Not on file  Social History Narrative   Lives alone    1 daughter- 1 grandchild    Drives without difficulty    Social Determinants of Health   Financial Resource Strain: Low Risk  (11/03/2022)   Overall Financial Resource Strain (CARDIA)    Difficulty of Paying Living  Expenses: Not hard at all  Food Insecurity: No Food Insecurity (11/03/2022)   Hunger Vital Sign    Worried About Running Out of Food in the Last Year: Never true    [place] in the Last Year: Never true  Transportation Needs: No Transportation Needs (11/03/2022)   PRAPARE - Hydrologist (Medical): No    Lack of Transportation (Non-Medical): No  Physical Activity: Inactive (11/03/2022)   Exercise Vital Sign    Days of Exercise per Week: 0 days    Minutes of Exercise per Session: 0 min  Stress: Stress Concern Present (11/03/2022)   [place]    Feeling of Stress : To some extent  Social Connections: Socially Isolated (11/03/2022)   Social Connection and Isolation Panel [NHANES]    Frequency of Communication with Friends and Family: More than three times a week    Frequency of Social Gatherings with Friends and Family: Once a week    Attends Religious Services: Never    Marine scientist or Organizations: No    Attends Archivist Meetings: Never    Marital Status: Widowed    Tobacco Counseling Counseling given: Not Answered   Clinical Intake:  Pre-visit preparation completed: Yes  Pain : No/denies pain     BMI - recorded: 39.44 Nutritional Risks: None Diabetes: No  How often do you need to have someone help you when you read instructions, pamphlets, or other written materials from your doctor or pharmacy?: 1 - Never  Diabetic?no  Interpreter Needed?: No  Information entered by :: Charlott Rakes, LPN   Activities of Daily Living    11/03/2022    2:45 PM  In your present state of health, do you have any difficulty performing the following activities:  Hearing? 0  Vision? 0  Difficulty concentrating or making decisions? 0  Walking or climbing stairs? 1  Dressing or bathing? 0  Doing errands, shopping? 0  Preparing Food and eating ? N  Using the Toilet? N   In the past six months, have you accidently leaked urine? N  Do you have problems with loss of bowel control? N  Managing your Medications? N  Managing your Finances? N  Housekeeping or managing your Housekeeping? N    Patient Care Team: Allwardt, Randa Evens, PA-C as PCP - General (Physician Assistant) Jola Schmidt, MD as Consulting Physician (Ophthalmology) Lavonna Monarch, MD (Inactive) as Consulting Physician (Dermatology)  Indicate any recent Medical Services you may have received from other than [facility] providers in the past year (date may be approximate).     Assessment:   This is a routine wellness examination for [place].  Hearing/Vision screen Hearing Screening - Comments:: Pt denies any hearing issues  Vision Screening - Comments:: Pt follows up with Dr Jola Schmidt for annual eye exams   Dietary issues and exercise activities discussed: Current Exercise Habits: The patient does not participate in regular exercise at present   Goals Addressed             This Visit's Progress    Patient Stated       Lose weight        Depression Screen    11/10/2022    1:36 PM 05/25/2022    1:10 PM 10/28/2021    1:40 PM 04/03/2020    1:28 PM 11/12/2019    3:20 PM 10/02/2019    1:01 PM 07/31/2019    1:32 PM  PHQ 2/9 Scores  PHQ - 2 Score 0 0 0 0 0 0 0  PHQ- 9 Score  4    3     Fall Risk    11/03/2022    2:45 PM 05/25/2022   12:59 PM 10/28/2021    1:43 PM 05/13/2021    1:08 PM 04/03/2020    1:27 PM  [place] in the past year? 0 0 0 0 0  Number falls in past yr: 0 0 0 0   Injury with Fall? 0 0 0    Risk for fall due to : Impaired vision No Fall Risks     Follow up Falls prevention discussed Falls evaluation completed Falls prevention discussed      FALL RISK PREVENTION PERTAINING TO THE HOME:  Any stairs in or around the home? No  If so, are there any without handrails? No  Home free of loose throw rugs in walkways, pet beds, electrical cords, etc? Yes  Adequate  lighting in your home to reduce risk of falls? Yes   ASSISTIVE DEVICES UTILIZED TO PREVENT FALLS:  Life alert? No  Use of a cane, walker or w/c? No  Grab bars in the bathroom? Yes  Shower chair or bench in shower? No  Elevated toilet seat or a handicapped toilet? No   TIMED UP AND GO:  Was the test performed? No .  Cognitive Function:    10/31/2018    3:52 PM  MMSE - Mini Mental State Exam  Orientation to time 5  Orientation to Place 5  Registration 3  Attention/ Calculation 5  Recall 3  Language- name 2 objects 2  Language- repeat 1  Language- follow 3 step command 3  Language- read & follow direction 1  Write a sentence 1  Copy design 1  Total score 30        11/10/2022    1:37 PM 10/28/2021    1:52 PM 11/12/2019    3:20 PM  6CIT Screen  What Year? 0 points 0 points 0 points  What month? 0 points 0 points 0 points  What time? 0 points 0 points 0 points  Count back from 20 0 points 0 points 0 points  Months in reverse 0 points 0 points 0 points  Repeat phrase 0 points 0 points 0 points  Total Score 0 points 0 points 0 points    Immunizations Immunization History  Administered Date(s) Administered   Fluad Quad(high Dose 65+) 05/02/2019, 05/13/2021, 05/25/2022   Influenza, High Dose Seasonal PF 05/03/2018   Influenza,inj,Quad PF,6+ Mos 06/05/2017   Influenza-Unspecified 06/24/2020   Moderna Sars-Covid-2 Vaccination 06/09/2022   PFIZER(Purple Top)SARS-COV-2 Vaccination 10/24/2019, 11/24/2019, 06/24/2020, 03/17/2021  Pneumococcal Polysaccharide-23 01/10/2018   Tdap 02/02/2018   Unspecified SARS-COV-2 Vaccination 06/09/2022    TDAP status: Up to date  Flu Vaccine status: Up to date  Pneumococcal vaccine status: Due, Education has been provided regarding the importance of this vaccine. Advised may receive this vaccine at local pharmacy or Health Dept. Aware to provide a copy of the vaccination record if obtained from local pharmacy or Health Dept. Verbalized  acceptance and understanding.  Covid-19 vaccine status: Completed vaccines  Qualifies for Shingles Vaccine? No    Screening Tests Health Maintenance  Topic Date Due   Pneumonia Vaccine 32+ Years old (2 of 2 - PCV) 01/11/2019   MAMMOGRAM  06/12/2022   COVID-19 Vaccine (6 - 2023-24 season) 08/04/2022   Medicare Annual Wellness (AWV)  11/10/2023   COLONOSCOPY (Pts 45-34yr Insurance coverage will need to be confirmed)  09/10/2027   DTaP/Tdap/Td (2 - Td or Tdap) 02/03/2028   INFLUENZA VACCINE  Completed   DEXA SCAN  Completed   Hepatitis C Screening  Completed   HPV VACCINES  Aged Out   Zoster Vaccines- Shingrix  Discontinued    Health Maintenance  Health Maintenance Due  Topic Date Due   Pneumonia Vaccine 72 Years old (2 of 2 - PCV) 01/11/2019   MAMMOGRAM  06/12/2022   COVID-19 Vaccine (6 - 2023-24 season) 08/04/2022    Colorectal cancer screening: Type of screening: Colonoscopy. Completed 09/09/22. Repeat every 5 years  Mammogram status: Ordered 11/10/22. Pt provided with contact info and advised to call to schedule appt.   Bone Density status: Completed 11/28/17. Results reflect: Bone density results: NORMAL. Repeat every 2 years.  Additional Screening:  Hepatitis C Screening:  Completed 10/17/18  Vision Screening: Recommended annual ophthalmology exams for early detection of glaucoma and other disorders of the eye. Is the patient up to date with their annual eye exam?  Yes  Who is the provider or what is the name of the office in which the patient attends annual eye exams? Dr BJola Schmidt If pt is not established with a provider, would they like to be referred to a provider to establish care? No .   Dental Screening: Recommended annual dental exams for proper oral hygiene  Community Resource Referral / Chronic Care Management: CRR required this visit?  No   CCM required this visit?  No      Plan:     I have personally reviewed and noted the following in the  patient's chart:   Medical and social history Use of alcohol, tobacco or illicit drugs  Current medications and supplements including opioid prescriptions. Patient is not currently taking opioid prescriptions. Functional ability and status Nutritional status Physical activity Advanced directives List of other physicians Hospitalizations, surgeries, and ER visits in previous 12 months Vitals Screenings to include cognitive, depression, and falls Referrals and appointments  In addition, I have reviewed and discussed with patient certain preventive protocols, quality metrics, and best practice recommendations. A written personalized care plan for preventive services as well as general preventive health recommendations were provided to patient.     TWillette Brace LPN   [id number]  Nurse Notes: none

## 2022-11-10 NOTE — Patient Instructions (Signed)
[name] , Thank you for taking time to come for your Medicare Wellness Visit. I appreciate your ongoing commitment to your health goals. Please review the following plan we discussed and let me know if I can assist you in the future.   These are the goals we discussed:  Goals      DIET - INCREASE WATER INTAKE     Patient Stated     Lose a few lbs      Patient Stated     Lose weight         This is a list of the screening recommended for you and due dates:  Health Maintenance  Topic Date Due   Pneumonia Vaccine (2 of 2 - PCV) 01/11/2019   Mammogram  06/12/2022   COVID-19 Vaccine (6 - 2023-24 season) 08/04/2022   Medicare Annual Wellness Visit  11/10/2023   Colon Cancer Screening  09/10/2027   DTaP/Tdap/Td vaccine (2 - Td or Tdap) 02/03/2028   Flu Shot  Completed   DEXA scan (bone density measurement)  Completed   Hepatitis C Screening: USPSTF Recommendation to screen - Ages 67-79 yo.  Completed   HPV Vaccine  Aged Out   Zoster (Shingles) Vaccine  Discontinued    Advanced directives: Please bring a copy of your health care power of attorney and living will to the office at your convenience.  Conditions/risks identified: lose some weight   Next appointment: Follow up in one year for your annual wellness visit    Preventive Care 65 Years and Older, Female Preventive care refers to lifestyle choices and visits with your health care provider that can promote health and wellness. What does preventive care include? A yearly physical exam. This is also called an annual well check. Dental exams once or twice a year. Routine eye exams. Ask your health care provider how often you should have your eyes checked. Personal lifestyle choices, including: Daily care of your teeth and gums. Regular physical activity. Eating a healthy diet. Avoiding tobacco and drug use. Limiting alcohol use. Practicing safe sex. Taking low-dose aspirin every day. Taking vitamin and mineral  supplements as recommended by your health care provider. What happens during an annual well check? The services and screenings done by your health care provider during your annual well check will depend on your age, overall health, lifestyle risk factors, and family history of disease. Counseling  Your health care provider may ask you questions about your: Alcohol use. Tobacco use. Drug use. Emotional well-being. Home and relationship well-being. Sexual activity. Eating habits. History of falls. Memory and ability to understand (cognition). Work and work Statistician. Reproductive health. Screening  You may have the following tests or measurements: Height, weight, and BMI. Blood pressure. Lipid and cholesterol levels. These may be checked every 5 years, or more frequently if you are over 1 years old. Skin check. Lung cancer screening. You may have this screening every year starting at age 18 if you have a 30-pack-year history of smoking and currently smoke or have quit within the past 15 years. Fecal occult blood test (FOBT) of the stool. You may have this test every year starting at age 28. Flexible sigmoidoscopy or colonoscopy. You may have a sigmoidoscopy every 5 years or a colonoscopy every 10 years starting at age 23. Hepatitis C blood test. Hepatitis B blood test. Sexually transmitted disease (STD) testing. Diabetes screening. This is done by checking your blood sugar (glucose) after you have not eaten for a while (fasting). You may  have this done every 1-3 years. Bone density scan. This is done to screen for osteoporosis. You may have this done starting at age 24. Mammogram. This may be done every 1-2 years. Talk to your health care provider about how often you should have regular mammograms. Talk with your health care provider about your test results, treatment options, and if necessary, the need for more tests. Vaccines  Your health care provider may recommend certain  vaccines, such as: Influenza vaccine. This is recommended every year. Tetanus, diphtheria, and acellular pertussis (Tdap, Td) vaccine. You may need a Td booster every 10 years. Zoster vaccine. You may need this after age 11. Pneumococcal 13-valent conjugate (PCV13) vaccine. One dose is recommended after age 10. Pneumococcal polysaccharide (PPSV23) vaccine. One dose is recommended after age 36. Talk to your health care provider about which screenings and vaccines you need and how often you need them. This information is not intended to replace advice given to you by your health care provider. Make sure you discuss any questions you have with your health care provider. Document Released: 09/11/2015 Document Revised: 05/04/2016 Document Reviewed: 06/16/2015 Elsevier Interactive Patient Education  2017 [place] Prevention in the Home Falls can cause injuries. They can happen to people of all ages. There are many things you can do to make your home safe and to help prevent falls. What can I do on the outside of my home? Regularly fix the edges of walkways and driveways and fix any cracks. Remove anything that might make you trip as you walk through a door, such as a raised step or threshold. Trim any bushes or trees on the path to your home. Use bright outdoor lighting. Clear any walking paths of anything that might make someone trip, such as rocks or tools. Regularly check to see if handrails are loose or broken. Make sure that both sides of any steps have handrails. Any raised decks and porches should have guardrails on the edges. Have any leaves, snow, or ice cleared regularly. Use sand or salt on walking paths during winter. Clean up any spills in your garage right away. This includes oil or grease spills. What can I do in the bathroom? Use night lights. Install grab bars by the toilet and in the tub and shower. Do not use towel bars as grab bars. Use non-skid mats or decals in  the tub or shower. If you need to sit down in the shower, use a plastic, non-slip stool. Keep the floor dry. Clean up any water that spills on the floor as soon as it happens. Remove soap buildup in the tub or shower regularly. Attach bath mats securely with double-sided non-slip rug tape. Do not have throw rugs and other things on the floor that can make you trip. What can I do in the bedroom? Use night lights. Make sure that you have a light by your bed that is easy to reach. Do not use any sheets or blankets that are too big for your bed. They should not hang down onto the floor. Have a firm chair that has side arms. You can use this for support while you get dressed. Do not have throw rugs and other things on the floor that can make you trip. What can I do in the kitchen? Clean up any spills right away. Avoid walking on wet floors. Keep items that you use a lot in easy-to-reach places. If you need to reach something above you, use a strong step  stool that has a grab bar. Keep electrical cords out of the way. Do not use floor polish or wax that makes floors slippery. If you must use wax, use non-skid floor wax. Do not have throw rugs and other things on the floor that can make you trip. What can I do with my stairs? Do not leave any items on the stairs. Make sure that there are handrails on both sides of the stairs and use them. Fix handrails that are broken or loose. Make sure that handrails are as long as the stairways. Check any carpeting to make sure that it is firmly attached to the stairs. Fix any carpet that is loose or worn. Avoid having throw rugs at the top or bottom of the stairs. If you do have throw rugs, attach them to the floor with carpet tape. Make sure that you have a light switch at the top of the stairs and the bottom of the stairs. If you do not have them, ask someone to add them for you. What else can I do to help prevent falls? Wear shoes that: Do not have high  heels. Have rubber bottoms. Are comfortable and fit you well. Are closed at the toe. Do not wear sandals. If you use a stepladder: Make sure that it is fully opened. Do not climb a closed stepladder. Make sure that both sides of the stepladder are locked into place. Ask someone to hold it for you, if possible. Clearly mark and make sure that you can see: Any grab bars or handrails. First and last steps. Where the edge of each step is. Use tools that help you move around (mobility aids) if they are needed. These include: Canes. Walkers. Scooters. Crutches. Turn on the lights when you go into a dark area. Replace any light bulbs as soon as they burn out. Set up your furniture so you have a clear path. Avoid moving your furniture around. If any of your floors are uneven, fix them. If there are any pets around you, be aware of where they are. Review your medicines with your doctor. Some medicines can make you feel dizzy. This can increase your chance of falling. Ask your doctor what other things that you can do to help prevent falls. This information is not intended to replace advice given to you by your health care provider. Make sure you discuss any questions you have with your health care provider. Document Released: 06/11/2009 Document Revised: 01/21/2016 Document Reviewed: 09/19/2014 Elsevier Interactive Patient Education  2017 [employer].

## 2022-11-23 ENCOUNTER — Ambulatory Visit: Payer: Medicare Other | Admitting: Physician Assistant

## 2022-11-24 ENCOUNTER — Encounter: Payer: Self-pay | Admitting: Physician Assistant

## 2022-11-24 ENCOUNTER — Ambulatory Visit (INDEPENDENT_AMBULATORY_CARE_PROVIDER_SITE_OTHER): Payer: Medicare Other | Admitting: Physician Assistant

## 2022-11-24 VITALS — BP 110/78 | HR 67 | Temp 97.6°F | Ht 65.0 in | Wt 241.0 lb

## 2022-11-24 DIAGNOSIS — Z23 Encounter for immunization: Secondary | ICD-10-CM | POA: Diagnosis not present

## 2022-11-24 DIAGNOSIS — R310 Gross hematuria: Secondary | ICD-10-CM | POA: Diagnosis not present

## 2022-11-24 DIAGNOSIS — I10 Essential (primary) hypertension: Secondary | ICD-10-CM

## 2022-11-24 DIAGNOSIS — N183 Chronic kidney disease, stage 3 unspecified: Secondary | ICD-10-CM

## 2022-11-24 DIAGNOSIS — R29898 Other symptoms and signs involving the musculoskeletal system: Secondary | ICD-10-CM

## 2022-11-24 DIAGNOSIS — E782 Mixed hyperlipidemia: Secondary | ICD-10-CM | POA: Diagnosis not present

## 2022-11-24 LAB — POC URINALSYSI DIPSTICK (AUTOMATED)
Bilirubin, UA: POSITIVE
Blood, UA: POSITIVE
Clarity, UA: NEGATIVE
Glucose, UA: POSITIVE — AB
Ketones, UA: POSITIVE
Nitrite, UA: NEGATIVE
Protein, UA: POSITIVE — AB
Spec Grav, UA: 1.025 (ref 1.010–1.025)
Urobilinogen, UA: 1 E.U./dL
pH, UA: 5.5 (ref 5.0–8.0)

## 2022-11-24 NOTE — Progress Notes (Signed)
Subjective:    Patient ID: Jordan Ware, female    DOB: 1951-06-29, 72 y.o.   MRN: [id number]  Chief Complaint  Patient presents with   Arthritis    Pt in the office to discuss new arthritis dx; pt also stating she has had blood in her urine for 1 wk; no other symptoms other wise; pt states the blood in urine has went away;     HPI Patient is in today for discussion about new arthritis diagnosis.   Weak muscles / weak legs, difficult to walk long distances, but did not complain of pain. Going on for years.  Did have one episode of sciatic pain for a few months- Tylenol helped, but bothersome.  Denies having any pain now. No pain with walking. If she bends down, some grinding in knees, but not painful. No falls. Not dropping anything. No numbness or tingling. No loss of bladder or bowel.   Bone length XRAY 09/23/22:  IMPRESSION: 1. No significant difference in leg length. 2. 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    Saturday - Tuesday - bleeding with urination; then resolved in the last few days. No pain with urination. Hx stones  Past Medical History:  Diagnosis Date   Anxiety 06/05/2017   Atrial fibrillation ([place])    Atypical mole 10/21/2020   Left Lower Arm (Moderate - Dr. Orma Flaming) [facility]   Diverticulosis    Dysthymia 06/05/2017   Gallstones 1998   HLD (hyperlipidemia) 06/05/2017   Hypertension    Internal hemorrhoids    Tubular adenoma of colon     Past Surgical History:  Procedure Laterality Date   APPENDECTOMY     CHOLECYSTECTOMY     partial   HERNIA REPAIR  2005, 2010   kidney stone Right 10/01/2021    Family History  Problem Relation Age of Onset   Breast cancer Mother 81   Colon cancer Mother 94   Prostate cancer Brother    Pancreatic cancer Maternal Grandmother    Rectal cancer Neg Hx    Esophageal cancer Neg Hx    Stomach cancer Neg Hx     Social History    Tobacco Use   Smoking status: Never   Smokeless tobacco: Never  Vaping Use   Vaping Use: Never used  Substance Use Topics   Alcohol use: No   Drug use: No     No Known Allergies  Review of Systems NEGATIVE UNLESS OTHERWISE INDICATED IN HPI      Objective:     BP 110/78 (BP Location: Left Arm)   Pulse 67   Temp 97.6 F (36.4 C) (Temporal)   Ht 5\' 5"  (1.651 m)   Wt 241 lb (109.3 kg)   SpO2 99%   BMI 40.10 kg/m   Wt Readings from Last 3 Encounters:  11/24/22 241 lb (109.3 kg)  11/10/22 237 lb (107.5 kg)  07/15/22 237 lb 6 oz (107.7 kg)    BP Readings from Last 3 Encounters:  11/24/22 110/78  09/09/22 114/68  07/15/22 110/64     Physical Exam Vitals and nursing note reviewed.  Constitutional:      Appearance: Normal appearance. She is obese.  Cardiovascular:     Rate and Rhythm: Normal rate and regular rhythm.     Pulses: Normal pulses.     Heart sounds: No murmur heard. Pulmonary:     Effort: Pulmonary effort is normal.     Breath sounds: Normal  breath sounds.  Abdominal:     General: Abdomen is flat.     Palpations: Abdomen is soft. There is no mass.     Tenderness: There is no right CVA tenderness or left CVA tenderness.  Neurological:     Mental Status: She is alert.  Psychiatric:        Mood and Affect: Mood normal.        Behavior: Behavior normal.        Assessment & Plan:  Leg weakness, bilateral Assessment & Plan: Recent o/v with Dr. Sammuel Hines - thinks her issues are coming from severe OA R hip, recommended replacement. Pt not sure this is the case. She's worried about her back being the issue. Agreed to have her do an XRAY L spine, consider a second opinion overall per patient preference.   Orders: -     DG Lumbar Spine Complete; Future  Gross hematuria -     POCT Urinalysis Dipstick (Automated) -     Comprehensive metabolic panel -     CBC with Differential/Platelet -     Urine Culture  Stage 3 chronic kidney disease,  unspecified whether stage 3a or 3b CKD (HCC) Assessment & Plan: Repeat labs, avoid NSAIDs  Orders: -     Comprehensive metabolic panel -     CBC with Differential/Platelet  Essential hypertension Assessment & Plan: Stable, at goal, cont nebivolol 20 mg daily   Orders: -     Comprehensive metabolic panel -     CBC with Differential/Platelet  Mixed hyperlipidemia Assessment & Plan: Crestor 5 mg daily; lipid panel today; lifestyle encouraged   Orders: -     Lipid panel  Need for prophylactic vaccination against Streptococcus pneumoniae (pneumococcus) -     Pneumococcal conjugate vaccine 20-valent    Recent gross hematuria as described in HPI - completely resolved, UA clear of blood here. I suspect she may have passed another renal stone. Watch and wait at this time.    Return in about 6 months (around 05/27/2023) for regular recheck .    Cleland Simkins M Mayerli Kirst, PA-C

## 2022-11-24 NOTE — Patient Instructions (Signed)
[facility]  [address], [place] [address] Tel: [phone] Hours: M-F 8:30 am - 5:00 pm Closed for lunch 12:30 pm - 1:00 pm   Labs and urine today

## 2022-11-25 LAB — COMPREHENSIVE METABOLIC PANEL
AG Ratio: 1.2 (calc) (ref 1.0–2.5)
ALT: 11 U/L (ref 6–29)
AST: 19 U/L (ref 10–35)
Albumin: 4.1 g/dL (ref 3.6–5.1)
Alkaline phosphatase (APISO): 70 U/L (ref 37–153)
BUN/Creatinine Ratio: 16 (calc) (ref 6–22)
BUN: 21 mg/dL (ref 7–25)
CO2: 23 mmol/L (ref 20–32)
Calcium: 9.7 mg/dL (ref 8.6–10.4)
Chloride: 101 mmol/L (ref 98–110)
Creat: 1.32 mg/dL — ABNORMAL HIGH (ref 0.60–1.00)
Globulin: 3.3 g/dL (calc) (ref 1.9–3.7)
Glucose, Bld: 106 mg/dL — ABNORMAL HIGH (ref 65–99)
Potassium: 4 mmol/L (ref 3.5–5.3)
Sodium: 141 mmol/L (ref 135–146)
Total Bilirubin: 0.8 mg/dL (ref 0.2–1.2)
Total Protein: 7.4 g/dL (ref 6.1–8.1)

## 2022-11-25 LAB — CBC WITH DIFFERENTIAL/PLATELET
Absolute Monocytes: 476 cells/uL (ref 200–950)
Basophils Absolute: 41 cells/uL (ref 0–200)
Basophils Relative: 0.5 %
Eosinophils Absolute: 131 cells/uL (ref 15–500)
Eosinophils Relative: 1.6 %
HCT: 43.5 % (ref 35.0–45.0)
Hemoglobin: 14.6 g/dL (ref 11.7–15.5)
Lymphs Abs: 2468 cells/uL (ref 850–3900)
MCH: 32.5 pg (ref 27.0–33.0)
MCHC: 33.6 g/dL (ref 32.0–36.0)
MCV: 96.9 fL (ref 80.0–100.0)
MPV: 9.2 fL (ref 7.5–12.5)
Monocytes Relative: 5.8 %
Neutro Abs: 5084 cells/uL (ref 1500–7800)
Neutrophils Relative %: 62 %
Platelets: 334 10*3/uL (ref 140–400)
RBC: 4.49 10*6/uL (ref 3.80–5.10)
RDW: 11.7 % (ref 11.0–15.0)
Total Lymphocyte: 30.1 %
WBC: 8.2 10*3/uL (ref 3.8–10.8)

## 2022-11-25 LAB — LIPID PANEL
Cholesterol: 139 mg/dL (ref <200)
HDL: 75 mg/dL (ref >=50)
LDL Cholesterol (Calc): 47 mg/dL (calc)
Non-HDL Cholesterol (Calc): 64 mg/dL (calc) (ref <130)
Total CHOL/HDL Ratio: 1.9 (calc) (ref <5.0)
Triglycerides: 90 mg/dL (ref <150)

## 2022-11-25 LAB — URINE CULTURE
MICRO NUMBER:: 14755128
SPECIMEN QUALITY:: ADEQUATE

## 2022-11-27 NOTE — Assessment & Plan Note (Signed)
Repeat labs, avoid NSAIDs

## 2022-11-27 NOTE — Assessment & Plan Note (Signed)
Recent o/v with Dr. Sammuel Hines - thinks her issues are coming from severe OA R hip, recommended replacement. Pt not sure this is the case. She's worried about her back being the issue. Agreed to have her do an XRAY L spine, consider a second opinion overall per patient preference.

## 2022-11-27 NOTE — Assessment & Plan Note (Signed)
Crestor 5 mg daily; lipid panel today; lifestyle encouraged

## 2022-11-27 NOTE — Assessment & Plan Note (Signed)
Stable, at goal, cont nebivolol 20 mg daily

## 2022-11-30 ENCOUNTER — Ambulatory Visit (INDEPENDENT_AMBULATORY_CARE_PROVIDER_SITE_OTHER)
Admission: RE | Admit: 2022-11-30 | Discharge: 2022-11-30 | Disposition: A | Discharge status: Home / Self Care | Payer: Medicare Other | Source: Ambulatory Visit | Attending: Physician Assistant | Admitting: Physician Assistant

## 2022-11-30 ENCOUNTER — Other Ambulatory Visit: Payer: Medicare Other

## 2022-11-30 DIAGNOSIS — M47816 Spondylosis without myelopathy or radiculopathy, lumbar region: Secondary | ICD-10-CM | POA: Diagnosis not present

## 2022-11-30 DIAGNOSIS — M545 Low back pain, unspecified: Secondary | ICD-10-CM | POA: Diagnosis not present

## 2022-11-30 DIAGNOSIS — R29898 Other symptoms and signs involving the musculoskeletal system: Secondary | ICD-10-CM

## 2022-12-06 DIAGNOSIS — Z961 Presence of intraocular lens: Secondary | ICD-10-CM | POA: Diagnosis not present

## 2022-12-22 ENCOUNTER — Other Ambulatory Visit: Payer: Self-pay | Admitting: Physician Assistant

## 2022-12-22 DIAGNOSIS — I10 Essential (primary) hypertension: Secondary | ICD-10-CM

## 2022-12-22 DIAGNOSIS — F419 Anxiety disorder, unspecified: Secondary | ICD-10-CM

## 2022-12-22 NOTE — Telephone Encounter (Signed)
Last OV; 11/24/22  Next OV: 06/01/23  Last Filled: 09/19/22 Xanax  07/05/22 Nebivolol  Quantity: 90 w/ 0 refills  Xanax      90 w/ 1 refill Nebivolol

## 2023-01-20 ENCOUNTER — Other Ambulatory Visit (HOSPITAL_BASED_OUTPATIENT_CLINIC_OR_DEPARTMENT_OTHER): Payer: Self-pay

## 2023-01-20 ENCOUNTER — Ambulatory Visit (HOSPITAL_BASED_OUTPATIENT_CLINIC_OR_DEPARTMENT_OTHER)
Admission: RE | Admit: 2023-01-20 | Discharge: 2023-01-20 | Disposition: A | Discharge status: Home / Self Care | Payer: Medicare Other | Source: Ambulatory Visit | Attending: Physician Assistant | Admitting: Physician Assistant

## 2023-01-20 DIAGNOSIS — Z1231 Encounter for screening mammogram for malignant neoplasm of breast: Secondary | ICD-10-CM | POA: Insufficient documentation

## 2023-03-19 ENCOUNTER — Other Ambulatory Visit: Payer: Self-pay | Admitting: Physician Assistant

## 2023-03-19 DIAGNOSIS — F341 Dysthymic disorder: Secondary | ICD-10-CM

## 2023-03-20 ENCOUNTER — Other Ambulatory Visit: Payer: Self-pay | Admitting: Physician Assistant

## 2023-03-20 DIAGNOSIS — F419 Anxiety disorder, unspecified: Secondary | ICD-10-CM

## 2023-03-22 ENCOUNTER — Other Ambulatory Visit: Payer: Self-pay | Admitting: Physician Assistant

## 2023-03-22 ENCOUNTER — Encounter: Payer: Self-pay | Admitting: Physician Assistant

## 2023-03-22 DIAGNOSIS — F419 Anxiety disorder, unspecified: Secondary | ICD-10-CM

## 2023-03-22 NOTE — Telephone Encounter (Signed)
LAST APPOINTMENT DATE: 11/24/2022   NEXT APPOINTMENT DATE: 06/01/2023    LAST REFILL:12/22/2022

## 2023-06-01 ENCOUNTER — Ambulatory Visit: Payer: Medicare Other | Admitting: Physician Assistant

## 2023-06-01 ENCOUNTER — Encounter: Payer: Self-pay | Admitting: Physician Assistant

## 2023-06-01 VITALS — BP 132/82 | HR 76 | Temp 97.8°F | Ht 65.0 in | Wt 251.8 lb

## 2023-06-01 DIAGNOSIS — G4700 Insomnia, unspecified: Secondary | ICD-10-CM | POA: Diagnosis not present

## 2023-06-01 DIAGNOSIS — Z23 Encounter for immunization: Secondary | ICD-10-CM

## 2023-06-01 DIAGNOSIS — R239 Unspecified skin changes: Secondary | ICD-10-CM | POA: Diagnosis not present

## 2023-06-01 DIAGNOSIS — F419 Anxiety disorder, unspecified: Secondary | ICD-10-CM

## 2023-06-01 MED ORDER — RAMELTEON 8 MG PO TABS
8.0000 mg | ORAL_TABLET | Freq: Every day | ORAL | 2 refills | Status: DC
Start: 2023-06-01 — End: 2023-06-28

## 2023-06-01 MED ORDER — ALPRAZOLAM 0.5 MG PO TABS
0.5000 mg | ORAL_TABLET | Freq: Two times a day (BID) | ORAL | 2 refills | Status: DC | PRN
Start: 2023-06-01 — End: 2023-09-19

## 2023-06-01 NOTE — Progress Notes (Signed)
Subjective:    Patient ID: Jordan Ware, female    DOB: 19-Feb-1951, 72 y.o.   MRN: [id number]  Chief Complaint  Patient presents with   Medical Management of Chronic Issues    Pt in for 6 mon f/u; pt states when seeing the ophthalmologist they pointed out place on left side of her face that looks red and dry; maybe need to see dermatology; pt states sleeping is causing some issues with her outlook on life, hard time sleeping at night, hard to get to sleep and stay asleep and been this way for years per patient.      HPI Patient is in today for regular follow-up. Also has a few concerns as listed-   She has an appt with dermatology in December for new red patches on left cheek and left lower cheek.   Difficulty sleeping for "decades." Now, more nights than not, she's awake all night. Sometimes doesn't even go into bed until 4 am knowing she won't sleep. After 24-30 hours without sleep, she gets a HA, legs hurt more, feels 'miserable.' Xanax does help her go to sleep.  Past Medical History:  Diagnosis Date   Anxiety 06/05/2017   Atrial fibrillation (HCC)    Atypical mole 10/21/2020   Left Lower Arm (Moderate - Dr. Orland Mustard) [facility]   Diverticulosis    Dysthymia 06/05/2017   Gallstones 1998   HLD (hyperlipidemia) 06/05/2017   Hypertension    Internal hemorrhoids    Tubular adenoma of colon     Past Surgical History:  Procedure Laterality Date   APPENDECTOMY     CHOLECYSTECTOMY     partial   HERNIA REPAIR  2005, 2010   kidney stone Right 10/01/2021    Family History  Problem Relation Age of Onset   Breast cancer Mother 56   Colon cancer Mother 40   Prostate cancer Brother    Pancreatic cancer Maternal Grandmother    Rectal cancer Neg Hx    Esophageal cancer Neg Hx    Stomach cancer Neg Hx     Social History   Tobacco Use   Smoking status: Never   Smokeless tobacco: Never  Vaping Use   Vaping status: Never Used  Substance Use Topics   Alcohol  use: No   Drug use: No     No Known Allergies  Review of Systems NEGATIVE UNLESS OTHERWISE INDICATED IN HPI      Objective:     BP 132/82 (BP Location: Left Arm)   Pulse 76   Temp 97.8 F (36.6 C) (Temporal)   Ht 5\' 5"  (1.651 m)   Wt 251 lb 12.8 oz (114.2 kg)   SpO2 97%   BMI 41.90 kg/m   Wt Readings from Last 3 Encounters:  06/01/23 251 lb 12.8 oz (114.2 kg)  11/24/22 241 lb (109.3 kg)  11/10/22 237 lb (107.5 kg)    BP Readings from Last 3 Encounters:  06/01/23 132/82  11/24/22 110/78  09/09/22 114/68     Physical Exam Vitals and nursing note reviewed.  Constitutional:      Appearance: Normal appearance. She is normal weight. She is not toxic-appearing.  HENT:     Head: Normocephalic and atraumatic.  Eyes:     Extraocular Movements: Extraocular movements intact.     Conjunctiva/sclera: Conjunctivae normal.     Pupils: Pupils are equal, round, and reactive to light.  Cardiovascular:     Rate and Rhythm: Normal rate. Rhythm irregular.     Pulses:  Normal pulses.     Heart sounds: Normal heart sounds.  Pulmonary:     Effort: Pulmonary effort is normal.     Breath sounds: Normal breath sounds.  Musculoskeletal:        General: Normal range of motion.     Cervical back: Normal range of motion and neck supple.  Skin:    General: Skin is warm and dry.     Comments: Dry flaking slightly red patch left cheek  Neurological:     General: No focal deficit present.     Mental Status: She is alert and oriented to person, place, and time.  Psychiatric:        Behavior: Behavior normal.        Assessment & Plan:  Insomnia, unspecified type Assessment & Plan: Chronic, stable with Alprazolam 0.5 mg at bedtime prn Will trial Rozerem 8 mg at bedtime; pt to reach out to let me know how this is going Sleep hygiene discussed again   Orders: -     Ramelteon; Take 1 tablet (8 mg total) by mouth at bedtime.  Dispense: 30 tablet; Refill: 2  Anxiety Assessment &  Plan:    06/01/2023    1:48 PM 11/24/2022    1:42 PM 07/31/2019    1:31 PM 10/03/2018    2:58 PM  GAD 7 : Generalized Anxiety Score  Nervous, Anxious, on Edge 1 1 0 0  Control/stop worrying 0 0 0 0  Worry too much - different things 0 0 0 0  Trouble relaxing 1 0 0 0  Restless 0 0 0 0  Easily annoyed or irritable 0 0 0 0  Afraid - awful might happen 0 0 0 0  Total GAD 7 Score 2 1 0 0  Anxiety Difficulty Somewhat difficult Not difficult at all Not difficult at all Not difficult at all    Chronic, stable, controlled with Alprazolam 0.5 mg 1 tab po BID prn Refilled for her today   Orders: -     Ramelteon; Take 1 tablet (8 mg total) by mouth at bedtime.  Dispense: 30 tablet; Refill: 2 -     ALPRAZolam; Take 1 tablet (0.5 mg total) by mouth 2 (two) times daily as needed for anxiety.  Dispense: 60 tablet; Refill: 2  Skin change  Immunization due -     Flu Vaccine Trivalent High Dose (Fluad)  --Left cheek - maybe early SCC, she is scheduled with dermatology      Return in about 6 months (around 11/30/2023) for recheck/follow-up.     Paton Crum M Anaclara Acklin, PA-C

## 2023-06-12 NOTE — Assessment & Plan Note (Signed)
    06/01/2023    1:48 PM 11/24/2022    1:42 PM 07/31/2019    1:31 PM 10/03/2018    2:58 PM  GAD 7 : Generalized Anxiety Score  Nervous, Anxious, on Edge 1 1 0 0  Control/stop worrying 0 0 0 0  Worry too much - different things 0 0 0 0  Trouble relaxing 1 0 0 0  Restless 0 0 0 0  Easily annoyed or irritable 0 0 0 0  Afraid - awful might happen 0 0 0 0  Total GAD 7 Score 2 1 0 0  Anxiety Difficulty Somewhat difficult Not difficult at all Not difficult at all Not difficult at all    Chronic, stable, controlled with Alprazolam 0.5 mg 1 tab po BID prn Refilled for her today

## 2023-06-12 NOTE — Assessment & Plan Note (Signed)
Chronic, stable with Alprazolam 0.5 mg at bedtime prn Will trial Rozerem 8 mg at bedtime; pt to reach out to let me know how this is going Sleep hygiene discussed again

## 2023-06-17 ENCOUNTER — Other Ambulatory Visit: Payer: Self-pay | Admitting: Physician Assistant

## 2023-06-17 DIAGNOSIS — I10 Essential (primary) hypertension: Secondary | ICD-10-CM

## 2023-06-27 ENCOUNTER — Other Ambulatory Visit: Payer: Self-pay | Admitting: Physician Assistant

## 2023-06-27 DIAGNOSIS — G4700 Insomnia, unspecified: Secondary | ICD-10-CM

## 2023-06-27 DIAGNOSIS — F419 Anxiety disorder, unspecified: Secondary | ICD-10-CM

## 2023-07-05 ENCOUNTER — Other Ambulatory Visit: Payer: Self-pay | Admitting: Physician Assistant

## 2023-07-05 DIAGNOSIS — I4819 Other persistent atrial fibrillation: Secondary | ICD-10-CM

## 2023-08-02 ENCOUNTER — Encounter: Payer: Self-pay | Admitting: Dermatology

## 2023-08-02 ENCOUNTER — Ambulatory Visit: Payer: Medicare Other | Admitting: Dermatology

## 2023-08-02 DIAGNOSIS — L820 Inflamed seborrheic keratosis: Secondary | ICD-10-CM | POA: Diagnosis not present

## 2023-08-02 DIAGNOSIS — L821 Other seborrheic keratosis: Secondary | ICD-10-CM

## 2023-08-02 DIAGNOSIS — Z85820 Personal history of malignant melanoma of skin: Secondary | ICD-10-CM

## 2023-08-02 NOTE — Progress Notes (Signed)
   New Patient Visit   Subjective  Jordan Ware is a 72 y.o. female who presents for the following: New Pt - SK  Patient states she has rash located at the face that she would like to have examined. Patient reports the areas have been there for 2 years. She reports the areas are not bothersome.Patient rates irritation 0 out of 10. She states that the areas have spread. Patient reports she not has previously been treated for these areas. Patient reports Hx of bx (melanoma). Patient denies family history of skin cancer(s).    The following portions of the chart were reviewed this encounter and updated as appropriate: medications, allergies, medical history  Review of Systems:  No other skin or systemic complaints except as noted in HPI or Assessment and Plan.  Objective  Well appearing patient in no apparent distress; mood and affect are within normal limits.   A focused examination was performed of the following areas: face   Relevant exam findings are noted in the Assessment and Plan.           Assessment & Plan    Inflamed Seborrheic Keratosis on the Face - Assessment: Patient presents with multiple rough, bumpy patches on the face, present for approximately 2 years. Clinical examination reveals the classic appearance of seborrheic keratosis, described as waxy, stuck-on growths varying in color from light brown to dark brown or pink. These lesions are benign, non-cancerous, and not related to sun exposure, attributed to genetics, maturity, and aging.  - Plan: Cryotherapy with liquid nitrogen applied to the lesions. Instruct patient to apply Aquaphor Healing Ointment to the treated areas twice daily (morning and night). Follow up in one month if lesions are not completely resolved. Provided samples of La Roche-Posay hydrating cleanser and Tularene moisturizer for general skincare. Patient education provided on the benign nature of the lesions and expected healing process.    Seborrheic keratosis (16) L cheek, L eyebrow, R cheek, R temple, R jawline, R upper lip  Destruction of lesion - L cheek, L eyebrow, R cheek, R temple, R jawline, R upper lip (16) Complexity: simple   Destruction method: cryotherapy   Informed consent: discussed and consent obtained   Timeout:  patient name, date of birth, surgical site, and procedure verified Lesion destroyed using liquid nitrogen: Yes   Region frozen until ice ball extended beyond lesion: Yes   Outcome: patient tolerated procedure well with no complications   Post-procedure details: wound care instructions given      No follow-ups on file.    Documentation: I have reviewed the above documentation for accuracy and completeness, and I agree with the above.   I, Shirron Marcha Solders, CMA, am acting as scribe for [employer], DO.      Langston Reusing, DO

## 2023-08-02 NOTE — Patient Instructions (Addendum)

## 2023-09-14 ENCOUNTER — Other Ambulatory Visit: Payer: Self-pay | Admitting: Physician Assistant

## 2023-09-14 DIAGNOSIS — F341 Dysthymic disorder: Secondary | ICD-10-CM

## 2023-09-16 ENCOUNTER — Other Ambulatory Visit: Payer: Self-pay | Admitting: Physician Assistant

## 2023-09-16 DIAGNOSIS — F419 Anxiety disorder, unspecified: Secondary | ICD-10-CM

## 2023-09-18 NOTE — Telephone Encounter (Signed)
 Duplicate Rx refill request.

## 2023-09-18 NOTE — Telephone Encounter (Signed)
Last OV: 06/01/23  Next OV: 11/13/23  Last Filled: 06/01/23  Quantity: 60 w/ 2 refills

## 2023-11-13 ENCOUNTER — Ambulatory Visit: Payer: Medicare Other

## 2023-11-13 VITALS — BP 132/82 | Ht 65.0 in | Wt 251.0 lb

## 2023-11-13 DIAGNOSIS — Z0000 Encounter for general adult medical examination without abnormal findings: Secondary | ICD-10-CM | POA: Diagnosis not present

## 2023-11-13 NOTE — Patient Instructions (Signed)
 Ms. Muzio , Thank you for taking time to come for your Medicare Wellness Visit. I appreciate your ongoing commitment to your health goals. Please review the following plan we discussed and let me know if I can assist you in the future.   Referrals/Orders/Follow-Ups/Clinician Recommendations: Keep up the great on maintaining your health.  This is a list of the screening recommended for you and due dates:  Health Maintenance  Topic Date Due   Medicare Annual Wellness Visit  11/10/2023   COVID-19 Vaccine (6 - 2024-25 season) 11/28/2024*   Mammogram  01/20/2024   Colon Cancer Screening  09/10/2027   DTaP/Tdap/Td vaccine (2 - Td or Tdap) 02/03/2028   Pneumonia Vaccine  Completed   Flu Shot  Completed   DEXA scan (bone density measurement)  Completed   Hepatitis C Screening  Completed   HPV Vaccine  Aged Out   Zoster (Shingles) Vaccine  Discontinued  *Topic was postponed. The date shown is not the original due date.    Advanced directives: (Declined) Advance directive discussed with you today. Even though you declined this today, please call our office should you change your mind, and we can give you the proper paperwork for you to fill out.  Next Medicare Annual Wellness Visit scheduled for next year: Yes

## 2023-11-13 NOTE — Progress Notes (Addendum)
 Subjective:   Jordan Ware is a 73 y.o. who presents for a Medicare Wellness preventive visit.  Visit Complete: Virtual I connected with  Pierce Crane on 11/15/23 by a audio enabled telemedicine application and verified that I am speaking with the correct person using two identifiers.  Patient Location: Home  Provider Location: Home Office  I discussed the limitations of evaluation and management by telemedicine. The patient expressed understanding and agreed to proceed.  Vital Signs: Because this visit was a virtual/telehealth visit, some criteria may be missing or patient reported. Any vitals not documented were not able to be obtained and vitals that have been documented are patient reported.  VideoDeclined- This patient declined Librarian, academic. Therefore the visit was completed with audio only.  Persons Participating in Visit: Patient.  AWV Questionnaire: No: Patient Medicare AWV questionnaire was not completed prior to this visit.  Cardiac Risk Factors include: advanced age (>14men, >80 women);dyslipidemia;hypertension;obesity (BMI >30kg/m2)     Objective:    Today's Vitals   11/13/23 1422  BP: 132/82  Weight: 251 lb (113.9 kg)  Height: 5\' 5"  (1.651 m)   Body mass index is 41.77 kg/m.     11/13/2023    2:47 PM 11/10/2022    1:35 PM 11/12/2019    3:18 PM 10/31/2018    3:48 PM  Advanced Directives  Does Patient Have a Medical Advance Directive? Yes Yes Yes Yes  Type of Estate agent of [place];Living will Healthcare Power of [place];Living will Healthcare Power of [place];Living will Living will  Does patient want to make changes to medical advance directive?   No - Patient declined No - Patient declined  Copy of Healthcare Power of Attorney in Chart? No - copy requested No - copy requested No - copy requested     Current Medications (verified) Outpatient Encounter Medications as of 11/13/2023   Medication Sig   ALPRAZolam (XANAX) 0.5 MG tablet TAKE 1 TABLET BY MOUTH 2 TIMES DAILY AS NEEDED FOR ANXIETY.   ASHWAGANDHA-RHODIOLA PO Take 1 tablet by mouth daily.   escitalopram (LEXAPRO) 20 MG tablet TAKE 1 TABLET BY MOUTH EVERY DAY   Flaxseed, Linseed, (FLAXSEED OIL) 1200 MG CAPS Take 1 capsule by mouth daily.   Multiple Vitamin (MULTI-VITAMIN DAILY PO) Take by mouth daily.   Nebivolol HCl 20 MG TABS TAKE 1 TABLET BY MOUTH EVERY DAY   rosuvastatin (CRESTOR) 5 MG tablet TAKE 1 TABLET BY MOUTH EVERY DAY   XARELTO 15 MG TABS tablet TAKE 1 TABLET BY MOUTH EVERY DAY   Facility-Administered Encounter Medications as of 11/13/2023  Medication   0.9 %  sodium chloride infusion    Allergies (verified) Patient has no known allergies.   History: Past Medical History:  Diagnosis Date   Anxiety 06/05/2017   Atrial fibrillation (HCC)    Atypical mole 10/21/2020   Left Lower Arm (Moderate - Dr. Orland Mustard) [facility]   Diverticulosis    Dysthymia 06/05/2017   Gallstones 1998   HLD (hyperlipidemia) 06/05/2017   Hypertension    Internal hemorrhoids    Tubular adenoma of colon    Past Surgical History:  Procedure Laterality Date   APPENDECTOMY     CHOLECYSTECTOMY     partial   HERNIA REPAIR  2005, 2010   kidney stone Right 10/01/2021   Family History  Problem Relation Age of Onset   Breast cancer Mother 32   Colon cancer Mother 41   Prostate cancer Brother  Pancreatic cancer Maternal Grandmother    Rectal cancer Neg Hx    Esophageal cancer Neg Hx    Stomach cancer Neg Hx    Social History   Socioeconomic History   Marital status: Widowed    Spouse name: Not on file   Number of children: 1   Years of education: Not on file   Highest education level: Bachelor's degree (e.g., BA, AB, BS)  Occupational History   Occupation: Retired   Tobacco Use   Smoking status: Never   Smokeless tobacco: Never  Vaping Use   Vaping status: Never Used  Substance and Sexual  Activity   Alcohol use: No   Drug use: No   Sexual activity: Yes    Partners: Male  Other Topics Concern   Not on file  Social History Narrative   Lives alone    1 daughter- 1 grandchild    Drives without difficulty    Social Drivers of Health   Financial Resource Strain: Low Risk  (11/13/2023)   Overall Financial Resource Strain (CARDIA)    Difficulty of Paying Living Expenses: Not hard at all  Food Insecurity: No Food Insecurity (11/10/2023)   Hunger Vital Sign    Worried About Running Out of Food in the Last Year: Never true    Ran Out of Food in the Last Year: Never true  Transportation Needs: No Transportation Needs (11/13/2023)   PRAPARE - Administrator, Civil Service (Medical): No    Lack of Transportation (Non-Medical): No  Physical Activity: Insufficiently Active (11/13/2023)   Exercise Vital Sign    Days of Exercise per Week: 1 day    Minutes of Exercise per Session: 20 min  Stress: No Stress Concern Present (11/13/2023)   [employer] of Occupational Health - Occupational Stress Questionnaire    Feeling of Stress : Not at all  Social Connections: Socially Isolated (11/13/2023)   Social Connection and Isolation Panel [NHANES]    Frequency of Communication with Friends and Family: Twice a week    Frequency of Social Gatherings with Friends and Family: Never    Attends Religious Services: Never    Database administrator or Organizations: No    Attends Banker Meetings: Never    Marital Status: Widowed    Tobacco Counseling Counseling given: Yes    Clinical Intake:  Pre-visit preparation completed: Yes  Pain : No/denies pain     BMI - recorded: 41.77 Nutritional Status: BMI > 30  Obese Nutritional Risks: Other (Comment) Diabetes: No  How often do you need to have someone help you when you read instructions, pamphlets, or other written materials from your doctor or pharmacy?: 1 - Never What is the last grade level you  completed in school?: Bachelor Degree  Interpreter Needed?: No  Information entered by :: Smith Mince   Activities of Daily Living     11/13/2023    2:28 PM  In your present state of health, do you have any difficulty performing the following activities:  Hearing? 0  Vision? 0  Difficulty concentrating or making decisions? 0  Walking or climbing stairs? 0  Dressing or bathing? 0  Doing errands, shopping? 0  Preparing Food and eating ? N  Using the Toilet? N  In the past six months, have you accidently leaked urine? N  Do you have problems with loss of bowel control? N  Managing your Medications? N  Managing your Finances? N  Housekeeping or managing your Housekeeping? N  Patient Care Team: Allwardt, Crist Infante, PA-C as PCP - General (Physician Assistant) Sinda Du, MD as Consulting Physician (Ophthalmology) Janalyn Harder, MD (Inactive) as Consulting Physician (Dermatology)  Indicate any recent Medical Services you may have received from other than [facility] providers in the past year (date may be approximate).     Assessment:   This is a routine wellness examination for [place].  Hearing/Vision screen Hearing Screening - Comments:: Pt denied hearing def Vision Screening - Comments:: Vision is good, update. Who[facility]ileen Fass at [facility] has appt made already   Goals Addressed             This Visit's Progress    DIET - INCREASE WATER INTAKE   On track    Patient Stated   On track    Lose a few lbs      Patient Stated   On track    Lose weight      Patient Stated       Improve sleeping habits       Depression Screen     11/13/2023    2:27 PM 06/01/2023    1:47 PM 11/24/2022    1:41 PM 11/10/2022    1:36 PM 05/25/2022    1:10 PM 10/28/2021    1:40 PM 04/03/2020    1:28 PM  PHQ 2/9 Scores  PHQ - 2 Score 0 0 0 0 0 0 0  PHQ- 9 Score 0 4 4  4       Fall Risk     11/13/2023    2:26 PM 11/13/2023    2:25 PM 06/01/2023    1:47 PM 11/24/2022     1:41 PM 11/03/2022    2:45 PM  Fall Risk   Falls in the past year?  1 0 0 0  Number falls in past yr: 0 0 0 0 0  Injury with Fall? 1 1 0 0 0  Risk for fall due to : No Fall Risks  No Fall Risks No Fall Risks Impaired vision  Follow up Falls evaluation completed;Education provided;Falls prevention discussed  Falls evaluation completed Falls evaluation completed Falls prevention discussed    MEDICARE RISK AT HOME:  Medicare Risk at Home Any stairs in or around the home?: No If so, are there any without handrails?: No Home free of loose throw rugs in walkways, pet beds, electrical cords, etc?: Yes Adequate lighting in your home to reduce risk of falls?: Yes Life alert?: No Use of a cane, walker or w/c?: No Grab bars in the bathroom?: Yes Shower chair or bench in shower?: No Elevated toilet seat or a handicapped toilet?: No  TIMED UP AND GO:  Was the test performed?  No  Cognitive Function: 6CIT completed    10/31/2018    3:52 PM  MMSE - Mini Mental State Exam  Orientation to time 5  Orientation to Place 5  Registration 3  Attention/ Calculation 5  Recall 3  Language- name 2 objects 2  Language- repeat 1  Language- follow 3 step command 3  Language- read & follow direction 1  Write a sentence 1  Copy design 1  Total score 30        11/13/2023    2:45 PM 11/10/2022    1:37 PM 10/28/2021    1:52 PM 11/12/2019    3:20 PM  6CIT Screen  What Year? 0 points 0 points 0 points 0 points  What month? 0 points 0 points 0 points 0 points  What  time? 0 points 0 points 0 points 0 points  Count back from 20 0 points 0 points 0 points 0 points  Months in reverse 0 points 0 points 0 points 0 points  Repeat phrase 0 points 0 points 0 points 0 points  Total Score 0 points 0 points 0 points 0 points    Immunizations Immunization History  Administered Date(s) Administered   Fluad Quad(high Dose 65+) 05/02/2019, 05/13/2021, 05/25/2022   Fluad Trivalent(High Dose 65+) 06/01/2023    Influenza, High Dose Seasonal PF 05/03/2018   Influenza,inj,Quad PF,6+ Mos 06/05/2017   Influenza-Unspecified 06/24/2020   Moderna Covid-19 Fall Seasonal Vaccine 52yrs & older 06/09/2022   Moderna Sars-Covid-2 Vaccination 06/09/2022   PFIZER Comirnaty(Gray Top)Covid-19 Tri-Sucrose Vaccine 03/17/2021   PFIZER(Purple Top)SARS-COV-2 Vaccination 10/24/2019, 11/24/2019, 06/24/2020, 03/17/2021   PNEUMOCOCCAL CONJUGATE-20 11/24/2022   Pneumococcal Polysaccharide-23 01/10/2018   Tdap 02/02/2018   Unspecified SARS-COV-2 Vaccination 06/09/2022    Screening Tests Health Maintenance  Topic Date Due   Medicare Annual Wellness (AWV)  11/10/2023   COVID-19 Vaccine (6 - 2024-25 season) 11/28/2024 (Originally 04/30/2023)   MAMMOGRAM  01/20/2024   Colonoscopy  09/10/2027   DTaP/Tdap/Td (2 - Td or Tdap) 02/03/2028   Pneumonia Vaccine 74+ Years old  Completed   INFLUENZA VACCINE  Completed   DEXA SCAN  Completed   Hepatitis C Screening  Completed   HPV VACCINES  Aged Out   Zoster Vaccines- Shingrix  Discontinued    Health Maintenance  Health Maintenance Due  Topic Date Due   Medicare Annual Wellness (AWV)  11/10/2023   Health Maintenance Items Addressed: See Nurse Notes  Additional Screening:  Vision Screening: Recommended annual ophthalmology exams for early detection of glaucoma and other disorders of the eye.  Dental Screening: Recommended annual dental exams for proper oral hygiene  Community Resource Referral / Chronic Care Management: CRR required this visit?  No   CCM required this visit?  No     Plan:     I have personally reviewed and noted the following in the patient's chart:   Medical and social history Use of alcohol, tobacco or illicit drugs  Current medications and supplements including opioid prescriptions. Patient is currently taking opioid prescriptions. Information provided to patient regarding non-opioid alternatives. Patient advised to discuss non-opioid  treatment plan with their provider. Functional ability and status Nutritional status Physical activity Advanced directives List of other physicians Hospitalizations, surgeries, and ER visits in previous 12 months Vitals Screenings to include cognitive, depression, and falls Referrals and appointments  In addition, I have reviewed and discussed with patient certain preventive protocols, quality metrics, and best practice recommendations. A written personalized care plan for preventive services as well as general preventive health recommendations were provided to patient.     Arta Silence, CMA   11/15/2023   After Visit Summary: (MyChart) Due to this being a telephonic visit, the after visit summary with patients personalized plan was offered to patient via MyChart   Notes: Nothing significant to report at this time.

## 2023-11-27 ENCOUNTER — Encounter: Payer: Self-pay | Admitting: Physician Assistant

## 2023-11-28 NOTE — Telephone Encounter (Signed)
 Please see patient msg regarding concerns with note in medical record fro AWV and note

## 2023-11-30 ENCOUNTER — Ambulatory Visit (INDEPENDENT_AMBULATORY_CARE_PROVIDER_SITE_OTHER): Payer: Medicare Other | Admitting: Physician Assistant

## 2023-11-30 ENCOUNTER — Encounter: Payer: Self-pay | Admitting: Physician Assistant

## 2023-11-30 VITALS — BP 138/88 | HR 88 | Temp 97.0°F | Ht 65.0 in | Wt 249.0 lb

## 2023-11-30 DIAGNOSIS — E782 Mixed hyperlipidemia: Secondary | ICD-10-CM | POA: Diagnosis not present

## 2023-11-30 DIAGNOSIS — G4700 Insomnia, unspecified: Secondary | ICD-10-CM

## 2023-11-30 DIAGNOSIS — F419 Anxiety disorder, unspecified: Secondary | ICD-10-CM | POA: Diagnosis not present

## 2023-11-30 DIAGNOSIS — R29898 Other symptoms and signs involving the musculoskeletal system: Secondary | ICD-10-CM | POA: Diagnosis not present

## 2023-11-30 DIAGNOSIS — N200 Calculus of kidney: Secondary | ICD-10-CM

## 2023-11-30 DIAGNOSIS — R7303 Prediabetes: Secondary | ICD-10-CM

## 2023-11-30 DIAGNOSIS — Z7901 Long term (current) use of anticoagulants: Secondary | ICD-10-CM | POA: Diagnosis not present

## 2023-11-30 DIAGNOSIS — I4891 Unspecified atrial fibrillation: Secondary | ICD-10-CM

## 2023-11-30 LAB — CBC WITH DIFFERENTIAL/PLATELET
Basophils Absolute: 0 10*3/uL (ref 0.0–0.1)
Basophils Relative: 0.7 % (ref 0.0–3.0)
Eosinophils Absolute: 0.1 10*3/uL (ref 0.0–0.7)
Eosinophils Relative: 1.9 % (ref 0.0–5.0)
HCT: 39.4 % (ref 36.0–46.0)
Hemoglobin: 13.4 g/dL (ref 12.0–15.0)
Lymphocytes Relative: 28.9 % (ref 12.0–46.0)
Lymphs Abs: 1.9 10*3/uL (ref 0.7–4.0)
MCHC: 33.9 g/dL (ref 30.0–36.0)
MCV: 94.9 fl (ref 78.0–100.0)
Monocytes Absolute: 0.4 10*3/uL (ref 0.1–1.0)
Monocytes Relative: 6.1 % (ref 3.0–12.0)
Neutro Abs: 4 10*3/uL (ref 1.4–7.7)
Neutrophils Relative %: 62.4 % (ref 43.0–77.0)
Platelets: 313 10*3/uL (ref 150.0–400.0)
RBC: 4.15 Mil/uL (ref 3.87–5.11)
RDW: 12.9 % (ref 11.5–15.5)
WBC: 6.5 10*3/uL (ref 4.0–10.5)

## 2023-11-30 LAB — COMPREHENSIVE METABOLIC PANEL WITH GFR
ALT: 11 U/L (ref 0–35)
AST: 20 U/L (ref 0–37)
Albumin: 4.1 g/dL (ref 3.5–5.2)
Alkaline Phosphatase: 55 U/L (ref 39–117)
BUN: 21 mg/dL (ref 6–23)
CO2: 33 meq/L — ABNORMAL HIGH (ref 19–32)
Calcium: 9.4 mg/dL (ref 8.4–10.5)
Chloride: 100 meq/L (ref 96–112)
Creatinine, Ser: 1.5 mg/dL — ABNORMAL HIGH (ref 0.40–1.20)
GFR: 34.57 mL/min — ABNORMAL LOW (ref >60.00)
Glucose, Bld: 105 mg/dL — ABNORMAL HIGH (ref 70–99)
Potassium: 3.4 meq/L — ABNORMAL LOW (ref 3.5–5.1)
Sodium: 140 meq/L (ref 135–145)
Total Bilirubin: 0.9 mg/dL (ref 0.2–1.2)
Total Protein: 7.4 g/dL (ref 6.0–8.3)

## 2023-11-30 LAB — LIPID PANEL
Cholesterol: 129 mg/dL (ref 0–200)
HDL: 67.2 mg/dL (ref >39.00)
LDL Cholesterol: 43 mg/dL (ref 0–99)
NonHDL: 62.16
Total CHOL/HDL Ratio: 2
Triglycerides: 94 mg/dL (ref 0.0–149.0)
VLDL: 18.8 mg/dL (ref 0.0–40.0)

## 2023-11-30 LAB — HEMOGLOBIN A1C: Hgb A1c MFr Bld: 5.5 % (ref 4.6–6.5)

## 2023-11-30 NOTE — Progress Notes (Signed)
 Patient ID: Jordan Ware, female    DOB: 07/26/1951, 73 y.o.   MRN: [id number]   Assessment & Plan:  Mixed hyperlipidemia -     Lipid panel  Prediabetes -     Comprehensive metabolic panel with GFR -     Lipid panel -     Hemoglobin A1c  Nephrolithiasis -     CBC with Differential/Platelet  Insomnia, unspecified type  Anxiety  Leg weakness, bilateral  Chronic anticoagulation  Atrial fibrillation, unspecified type (HCC)      Assessment and Plan Assessment & Plan Chronic Insomnia Chronic insomnia persists, but improving. Ramelteon was ineffective and caused grogginess. Xanax 0.5 mg, 1-2 tablets as needed, provides relief and peace of mind. - Continue Xanax 0.5 mg as needed for sleep.  Anxiety Anxiety is well-managed with Xanax as needed and Lexapro 20 mg daily. - Continue Lexapro 20 mg daily. - Continue Xanax as needed for anxiety.  Chronic Atrial Fibrillation Chronic atrial fibrillation is well-controlled with Xarelto 15 mg daily for anticoagulation and nebivolol 20 mg daily for rate control. No chest pain or dyspnea reported. - Continue Xarelto 15 mg daily. - Continue nebivolol 20 mg daily.  Kidney Stones Kidney stones with no recent episodes. Two episodes of painless hematuria resolved spontaneously. She wishes to avoid recurrence after previous lithotripsy.  Seborrheic Keratosis Seborrheic keratosis on the face treated with liquid nitrogen by a dermatologist. The lesion was benign, and she is satisfied with the cosmetic outcome. Potential for recurrence noted, but no current concerns.  General Health Maintenance Routine health maintenance is up to date. Mammogram last year; colonoscopy due in 2029. Blood pressure slightly elevated, possibly due to anxiety. Chronic bilateral leg weakness present, discussed this previously, pt doesn't want any interventions.  - Order CBC, CMP, lipid panel. - Encourage walking as tolerated. - Advise on moisturizing  skin post-shower to manage dryness.  Follow-up Routine follow-up and monitoring of chronic conditions and medication management. - Schedule follow-up in six months. - Message through MyChart if concerns arise from blood work.      Return in about 6 months (around 05/31/2024) for recheck/follow-up.    Subjective:    Chief Complaint  Patient presents with   Medical Management of Chronic Issues    Pt in office for 6 mon f/u , pt states insomnia has improved; no concerns to discuss otherwise;    HPI  History of Present Illness Jordan Ware is a 73 year old female who presents for a six-month regular follow-up.  She has chronic insomnia and previously tried Ramelteon without relief, experiencing grogginess and feeling unwell the next day. She stopped taking it and now uses Xanax 0.5 mg, taking one to two tablets in the evening as needed, which provides peace of mind and helps with sleep.  She has a history of kidney stones and reports two episodes of painless hematuria since her last visit, which resolved within a week. She feels a bit uncomfortable during these episodes but has not experienced them in a couple of months. She previously saw a urologist and had the stones treated.  She has chronic atrial fibrillation and is on Xarelto 15 mg daily and nebivolol 20 mg daily for rate control. No chest pain or shortness of breath.  She experiences bilateral leg weakness, which is chronic, and manages to get around despite not being able to walk far. She also reports some puffiness in her legs, which is usual for her.  She had a seborrheic keratosis  removed from her face by a dermatologist using liquid nitrogen. She is pleased with the results, noting that the area looked worse before it improved, but now it looks great.  She is on Lexapro 20 mg daily for anxiety and finds it stable. She has dry skin on her legs and uses showers rather than baths.  Routine health maintenance was  discussed, including blood work and screenings. No new bowel changes, blood in the stool, or new aches or pains. No urinary symptoms currently and no swelling in her legs beyond usual puffiness.     Past Medical History:  Diagnosis Date   Anxiety 06/05/2017   Atrial fibrillation (HCC)    Atypical mole 10/21/2020   Left Lower Arm (Moderate - Dr. Orland Mustard) [facility]   Diverticulosis    Dysthymia 06/05/2017   Gallstones 1998   HLD (hyperlipidemia) 06/05/2017   Hypertension    Internal hemorrhoids    Kidney stones    Tubular adenoma of colon     Past Surgical History:  Procedure Laterality Date   APPENDECTOMY     CHOLECYSTECTOMY     partial   HERNIA REPAIR  2005, 2010   kidney stone Right 10/01/2021    Family History  Problem Relation Age of Onset   Breast cancer Mother 50   Colon cancer Mother 61   Prostate cancer Brother    Pancreatic cancer Maternal Grandmother    Rectal cancer Neg Hx    Esophageal cancer Neg Hx    Stomach cancer Neg Hx     Social History   Tobacco Use   Smoking status: Never   Smokeless tobacco: Never  Vaping Use   Vaping status: Never Used  Substance Use Topics   Alcohol use: No   Drug use: No     No Known Allergies  Review of Systems NEGATIVE UNLESS OTHERWISE INDICATED IN HPI      Objective:     BP 138/88 (BP Location: Left Arm, Patient Position: Sitting, Cuff Size: Normal)   Pulse 88   Temp (!) 97 F (36.1 C) (Temporal)   Ht 5\' 5"  (1.651 m)   Wt 249 lb (112.9 kg)   SpO2 95%   BMI 41.44 kg/m   Wt Readings from Last 3 Encounters:  11/30/23 249 lb (112.9 kg)  11/13/23 251 lb (113.9 kg)  06/01/23 251 lb 12.8 oz (114.2 kg)    BP Readings from Last 3 Encounters:  11/30/23 138/88  11/13/23 132/82  06/01/23 132/82     Physical Exam Vitals and nursing note reviewed.  Constitutional:      Appearance: Normal appearance. She is obese. She is not toxic-appearing.  HENT:     Head: Normocephalic and atraumatic.   Eyes:     Extraocular Movements: Extraocular movements intact.     Conjunctiva/sclera: Conjunctivae normal.     Pupils: Pupils are equal, round, and reactive to light.  Cardiovascular:     Rate and Rhythm: Normal rate. Rhythm irregular.     Pulses: Normal pulses.     Heart sounds: Normal heart sounds.  Pulmonary:     Effort: Pulmonary effort is normal.     Breath sounds: Normal breath sounds.  Musculoskeletal:        General: Normal range of motion.     Cervical back: Normal range of motion and neck supple.     Right lower leg: Edema present.     Left lower leg: Edema present.     Comments: Trace edema BLE's  Skin:  General: Skin is warm and dry.  Neurological:     General: No focal deficit present.     Mental Status: She is alert and oriented to person, place, and time.     Gait: Gait abnormal.  Psychiatric:        Behavior: Behavior normal.           Milinda Sweeney M Korbin Notaro, PA-C

## 2023-12-04 ENCOUNTER — Encounter: Payer: Self-pay | Admitting: Physician Assistant

## 2023-12-04 ENCOUNTER — Other Ambulatory Visit: Payer: Self-pay | Admitting: Physician Assistant

## 2023-12-04 DIAGNOSIS — N183 Chronic kidney disease, stage 3 unspecified: Secondary | ICD-10-CM

## 2023-12-04 MED ORDER — DAPAGLIFLOZIN PROPANEDIOL 10 MG PO TABS: 10.0000 mg | ORAL_TABLET | Freq: Every day | ORAL | 2 refills | Status: DC

## 2023-12-04 NOTE — Telephone Encounter (Signed)
 Per pharmacy, prescription isn't covered under current insurance plan and will need to consider an alternative. Please advise

## 2023-12-05 ENCOUNTER — Other Ambulatory Visit: Payer: Self-pay | Admitting: Physician Assistant

## 2023-12-05 MED ORDER — EMPAGLIFLOZIN 10 MG PO TABS: 10.0000 mg | ORAL_TABLET | Freq: Every day | ORAL | 2 refills | Status: DC

## 2023-12-14 ENCOUNTER — Other Ambulatory Visit: Payer: Self-pay | Admitting: Physician Assistant

## 2023-12-14 DIAGNOSIS — I10 Essential (primary) hypertension: Secondary | ICD-10-CM

## 2023-12-17 ENCOUNTER — Other Ambulatory Visit: Payer: Self-pay | Admitting: Physician Assistant

## 2023-12-17 DIAGNOSIS — F419 Anxiety disorder, unspecified: Secondary | ICD-10-CM

## 2023-12-19 NOTE — Telephone Encounter (Signed)
 Last OV: 11/30/23  Next OV: 06/06/24  Last Filled: 09/19/23  Quantity: 60 w/ 2 refills

## 2023-12-22 NOTE — Progress Notes (Signed)
 Subjective:   Jordan Ware is a 73 y.o. who presents for a Medicare Wellness preventive visit.  Visit Complete: Virtual I connected with  Jordan Ware on 12/22/23 by a audio enabled telemedicine application and verified that I am speaking with the correct person using two identifiers.  Patient Location: Home  Provider Location: Home Office  I discussed the limitations of evaluation and management by telemedicine. The patient expressed understanding and agreed to proceed.  Vital Signs: Because this visit was a virtual/telehealth visit, some criteria may be missing or patient reported. Any vitals not documented were not able to be obtained and vitals that have been documented are patient reported.  VideoDeclined- This patient declined Librarian, academic. Therefore the visit was completed with audio only.  Persons Participating in Visit: Patient.  AWV Questionnaire: No: Patient Medicare AWV questionnaire was not completed prior to this visit.  Cardiac Risk Factors include: advanced age (>67men, >67 women);dyslipidemia;hypertension;obesity (BMI >30kg/m2)     Objective:    Today's Vitals   11/13/23 1422  BP: 132/82  Weight: 251 lb (113.9 kg)  Height: 5\' 5"  (1.651 m)   Body mass index is 41.77 kg/m.     11/13/2023    2:47 PM 11/10/2022    1:35 PM 11/12/2019    3:18 PM 10/31/2018    3:48 PM  Advanced Directives  Does Patient Have a Medical Advance Directive? Yes Yes Yes Yes  Type of Estate agent of [place];Living will Healthcare Power of [place];Living will Healthcare Power of [place];Living will Living will  Does patient want to make changes to medical advance directive?   No - Patient declined No - Patient declined  Copy of Healthcare Power of Attorney in Chart? No - copy requested No - copy requested No - copy requested     Current Medications (verified) Outpatient Encounter Medications as of 11/13/2023   Medication Sig   ASHWAGANDHA-RHODIOLA PO Take 1 tablet by mouth daily.   escitalopram  (LEXAPRO ) 20 MG tablet TAKE 1 TABLET BY MOUTH EVERY DAY   Flaxseed, Linseed, (FLAXSEED OIL) 1200 MG CAPS Take 1 capsule by mouth daily.   Multiple Vitamin (MULTI-VITAMIN DAILY PO) Take by mouth daily.   rosuvastatin  (CRESTOR ) 5 MG tablet TAKE 1 TABLET BY MOUTH EVERY DAY   XARELTO  15 MG TABS tablet TAKE 1 TABLET BY MOUTH EVERY DAY   [DISCONTINUED] ALPRAZolam  (XANAX ) 0.5 MG tablet TAKE 1 TABLET BY MOUTH 2 TIMES DAILY AS NEEDED FOR ANXIETY.   [DISCONTINUED] Nebivolol  HCl 20 MG TABS TAKE 1 TABLET BY MOUTH EVERY DAY   Facility-Administered Encounter Medications as of 11/13/2023  Medication   0.9 %  sodium chloride  infusion    Allergies (verified) Patient has no known allergies.   History: Past Medical History:  Diagnosis Date   Anxiety 06/05/2017   Atrial fibrillation (HCC)    Atypical mole 10/21/2020   Left Lower Arm (Moderate - Dr. Raymona Caldwell) [facility]   Diverticulosis    Dysthymia 06/05/2017   Gallstones 1998   HLD (hyperlipidemia) 06/05/2017   Hypertension    Internal hemorrhoids    Kidney stones    Tubular adenoma of colon    Past Surgical History:  Procedure Laterality Date   APPENDECTOMY     CHOLECYSTECTOMY     partial   HERNIA REPAIR  2005, 2010   kidney stone Right 10/01/2021   Family History  Problem Relation Age of Onset   Breast cancer Mother 27   Colon cancer Mother 11  Prostate cancer Brother    Pancreatic cancer Maternal Grandmother    Rectal cancer Neg Hx    Esophageal cancer Neg Hx    Stomach cancer Neg Hx    Social History   Socioeconomic History   Marital status: Widowed    Spouse name: Not on file   Number of children: 1   Years of education: Not on file   Highest education level: Bachelor's degree (e.g., BA, AB, BS)  Occupational History   Occupation: Retired   Tobacco Use   Smoking status: Never   Smokeless tobacco: Never  Vaping Use   Vaping  status: Never Used  Substance and Sexual Activity   Alcohol use: No   Drug use: No   Sexual activity: Yes    Partners: Male  Other Topics Concern   Not on file  Social History Narrative   Lives alone    1 daughter- 1 grandchild    Drives without difficulty    Social Drivers of Health   Financial Resource Strain: Low Risk  (11/13/2023)   Overall Financial Resource Strain (CARDIA)    Difficulty of Paying Living Expenses: Not hard at all  Food Insecurity: No Food Insecurity (11/10/2023)   Hunger Vital Sign    Worried About Running Out of Food in the Last Year: Never true    Ran Out of Food in the Last Year: Never true  Transportation Needs: No Transportation Needs (11/13/2023)   PRAPARE - Administrator, Civil Service (Medical): No    Lack of Transportation (Non-Medical): No  Physical Activity: Insufficiently Active (11/13/2023)   Exercise Vital Sign    Days of Exercise per Week: 1 day    Minutes of Exercise per Session: 20 min  Stress: No Stress Concern Present (11/13/2023)   [employer] of Occupational Health - Occupational Stress Questionnaire    Feeling of Stress : Not at all  Social Connections: Socially Isolated (11/13/2023)   Social Connection and Isolation Panel [NHANES]    Frequency of Communication with Friends and Family: Twice a week    Frequency of Social Gatherings with Friends and Family: Never    Attends Religious Services: Never    Database administrator or Organizations: No    Attends Banker Meetings: Never    Marital Status: Widowed    Tobacco Counseling Counseling given: Yes    Clinical Intake:  Pre-visit preparation completed: Yes  Pain : No/denies pain     BMI - recorded: 41.77 Nutritional Status: BMI > 30  Obese Nutritional Risks: Other (Comment) Diabetes: No  How often do you need to have someone help you when you read instructions, pamphlets, or other written materials from your doctor or pharmacy?: 1 -  Never What is the last grade level you completed in school?: Bachelor Degree  Interpreter Needed?: No  Information entered by :: Marlena Sima   Activities of Daily Living     11/13/2023    2:28 PM  In your present state of health, do you have any difficulty performing the following activities:  Hearing? 0  Vision? 0  Difficulty concentrating or making decisions? 0  Walking or climbing stairs? 0  Dressing or bathing? 0  Doing errands, shopping? 0  Preparing Food and eating ? N  Using the Toilet? N  In the past six months, have you accidently leaked urine? N  Do you have problems with loss of bowel control? N  Managing your Medications? N  Managing your Finances? N  Housekeeping or managing your Housekeeping? N    Patient Care Team: Allwardt, Alyssa M, PA-C as PCP - General (Physician Assistant) Cindra Cree, MD as Consulting Physician (Ophthalmology) Devon Fogo, MD (Inactive) as Consulting Physician (Dermatology)  Indicate any recent Medical Services you may have received from other than [facility] providers in the past year (date may be approximate).     Assessment:   This is a routine wellness examination for [place].  Hearing/Vision screen Hearing Screening - Comments:: Pt denied hearing def Vision Screening - Comments:: Vision is good, update. Who-Dr. Linde Reveal at [facility] has appt made already   Goals Addressed             This Visit's Progress    DIET - INCREASE WATER INTAKE   On track    Patient Stated   On track    Lose a few lbs      Patient Stated   On track    Lose weight      Patient Stated       Improve sleeping habits       Depression Screen     11/30/2023    1:34 PM 11/13/2023    2:27 PM 06/01/2023    1:47 PM 11/24/2022    1:41 PM 11/10/2022    1:36 PM 05/25/2022    1:10 PM 10/28/2021    1:40 PM  PHQ 2/9 Scores  PHQ - 2 Score 0 0 0 0 0 0 0  PHQ- 9 Score  0 4 4  4      Fall Risk     11/30/2023    1:34 PM 11/13/2023     2:26 PM 11/13/2023    2:25 PM 06/01/2023    1:47 PM 11/24/2022    1:41 PM  Fall Risk   Falls in the past year? 0  1 0 0  Number falls in past yr: 0 0 0 0 0  Injury with Fall? 0 1 1 0 0  Risk for fall due to : No Fall Risks No Fall Risks  No Fall Risks No Fall Risks  Follow up Falls evaluation completed Falls evaluation completed;Education provided;Falls prevention discussed  Falls evaluation completed Falls evaluation completed    MEDICARE RISK AT HOME:  Medicare Risk at Home Any stairs in or around the home?: No If so, are there any without handrails?: No Home free of loose throw rugs in walkways, pet beds, electrical cords, etc?: Yes Adequate lighting in your home to reduce risk of falls?: Yes Life alert?: No Use of a cane, walker or w/c?: No Grab bars in the bathroom?: Yes Shower chair or bench in shower?: No Elevated toilet seat or a handicapped toilet?: No  TIMED UP AND GO:  Was the test performed?  No  Cognitive Function: 6CIT completed    10/31/2018    3:52 PM  MMSE - Mini Mental State Exam  Orientation to time 5  Orientation to Place 5  Registration 3  Attention/ Calculation 5  Recall 3  Language- name 2 objects 2  Language- repeat 1  Language- follow 3 step command 3  Language- read & follow direction 1  Write a sentence 1  Copy design 1  Total score 30        11/13/2023    2:45 PM 11/10/2022    1:37 PM 10/28/2021    1:52 PM 11/12/2019    3:20 PM  6CIT Screen  What Year? 0 points 0 points 0 points 0 points  What month?  0 points 0 points 0 points 0 points  What time? 0 points 0 points 0 points 0 points  Count back from 20 0 points 0 points 0 points 0 points  Months in reverse 0 points 0 points 0 points 0 points  Repeat phrase 0 points 0 points 0 points 0 points  Total Score 0 points 0 points 0 points 0 points    Immunizations Immunization History  Administered Date(s) Administered   Fluad Quad(high Dose 65+) 05/02/2019, 05/13/2021, 05/25/2022   Fluad  Trivalent(High Dose 65+) 06/01/2023   Influenza, High Dose Seasonal PF 05/03/2018   Influenza,inj,Quad PF,6+ Mos 06/05/2017   Influenza-Unspecified 06/24/2020   Moderna Covid-19 Fall Seasonal Vaccine 36yrs & older 06/09/2022   Moderna Sars-Covid-2 Vaccination 06/09/2022   PFIZER Comirnaty(Gray Top)Covid-19 Tri-Sucrose Vaccine 03/17/2021   PFIZER(Purple Top)SARS-COV-2 Vaccination 10/24/2019, 11/24/2019, 06/24/2020, 03/17/2021   PNEUMOCOCCAL CONJUGATE-20 11/24/2022   Pneumococcal Polysaccharide-23 01/10/2018   Tdap 02/02/2018   Unspecified SARS-COV-2 Vaccination 06/09/2022    Screening Tests Health Maintenance  Topic Date Due   COVID-19 Vaccine (6 - 2024-25 season) 11/28/2024 (Originally 04/30/2023)   MAMMOGRAM  01/20/2024   INFLUENZA VACCINE  03/29/2024   Medicare Annual Wellness (AWV)  11/12/2024   Colonoscopy  09/10/2027   DTaP/Tdap/Td (2 - Td or Tdap) 02/03/2028   Pneumonia Vaccine 6+ Years old  Completed   DEXA SCAN  Completed   Hepatitis C Screening  Completed   HPV VACCINES  Aged Out   Meningococcal B Vaccine  Aged Out   Zoster Vaccines- Shingrix  Discontinued    Health Maintenance  There are no preventive care reminders to display for this patient.  Health Maintenance Items Addressed: See Nurse Notes  Additional Screening:  Vision Screening: Recommended annual ophthalmology exams for early detection of glaucoma and other disorders of the eye.  Dental Screening: Recommended annual dental exams for proper oral hygiene  Community Resource Referral / Chronic Care Management: CRR required this visit?  No   CCM required this visit?  No     Plan:     I have personally reviewed and noted the following in the patient's chart:   Medical and social history Use of alcohol, tobacco or illicit drugs  Current medications and supplements including opioid prescriptions. Patient is currently not opioid prescriptions. Information provided to patient regarding non-opioid  alternatives. Patient advised to discuss non-opioid treatment plan with their provider. Functional ability and status Nutritional status Physical activity Advanced directives List of other physicians Hospitalizations, surgeries, and ER visits in previous 12 months Vitals Screenings to include cognitive, depression, and falls Referrals and appointments  In addition, I have reviewed and discussed with patient certain preventive protocols, quality metrics, and best practice recommendations. A written personalized care plan for preventive services as well as general preventive health recommendations were provided to patient.     Michaelle Adolphus, CMA   12/22/2023   After Visit Summary: (MyChart) Due to this being a telephonic visit, the after visit summary with patients personalized plan was offered to patient via MyChart   Notes: Nothing significant to report at this time.

## 2024-01-25 DIAGNOSIS — Z961 Presence of intraocular lens: Secondary | ICD-10-CM | POA: Diagnosis not present

## 2024-02-20 ENCOUNTER — Other Ambulatory Visit: Payer: Self-pay | Admitting: Physician Assistant

## 2024-03-20 ENCOUNTER — Other Ambulatory Visit: Payer: Self-pay | Admitting: Physician Assistant

## 2024-03-20 DIAGNOSIS — F341 Dysthymic disorder: Secondary | ICD-10-CM

## 2024-03-22 ENCOUNTER — Other Ambulatory Visit: Payer: Self-pay | Admitting: Physician Assistant

## 2024-03-22 DIAGNOSIS — F419 Anxiety disorder, unspecified: Secondary | ICD-10-CM

## 2024-03-22 NOTE — Telephone Encounter (Signed)
 Pt requesting refill for Alprazolam  0.5 mg tablet. Last OV 11/30/2023. Pt is scheduled for OV in Oct.

## 2024-05-23 ENCOUNTER — Ambulatory Visit: Admitting: Physician Assistant

## 2024-05-30 ENCOUNTER — Other Ambulatory Visit: Payer: Self-pay | Admitting: Physician Assistant

## 2024-06-06 ENCOUNTER — Ambulatory Visit: Admitting: Physician Assistant

## 2024-06-18 ENCOUNTER — Encounter: Payer: Self-pay | Admitting: Physician Assistant

## 2024-06-18 ENCOUNTER — Other Ambulatory Visit: Payer: Self-pay | Admitting: Physician Assistant

## 2024-06-18 ENCOUNTER — Ambulatory Visit (INDEPENDENT_AMBULATORY_CARE_PROVIDER_SITE_OTHER): Admitting: Physician Assistant

## 2024-06-18 VITALS — BP 122/82 | HR 71 | Temp 97.2°F | Ht 65.0 in | Wt 245.0 lb

## 2024-06-18 DIAGNOSIS — I4891 Unspecified atrial fibrillation: Secondary | ICD-10-CM

## 2024-06-18 DIAGNOSIS — N1832 Chronic kidney disease, stage 3b: Secondary | ICD-10-CM

## 2024-06-18 DIAGNOSIS — G4700 Insomnia, unspecified: Secondary | ICD-10-CM | POA: Diagnosis not present

## 2024-06-18 DIAGNOSIS — F419 Anxiety disorder, unspecified: Secondary | ICD-10-CM

## 2024-06-18 DIAGNOSIS — I10 Essential (primary) hypertension: Secondary | ICD-10-CM

## 2024-06-18 DIAGNOSIS — Z6841 Body Mass Index (BMI) 40.0 and over, adult: Secondary | ICD-10-CM

## 2024-06-18 DIAGNOSIS — Z85820 Personal history of malignant melanoma of skin: Secondary | ICD-10-CM

## 2024-06-18 DIAGNOSIS — Z23 Encounter for immunization: Secondary | ICD-10-CM | POA: Diagnosis not present

## 2024-06-18 DIAGNOSIS — Z7901 Long term (current) use of anticoagulants: Secondary | ICD-10-CM | POA: Diagnosis not present

## 2024-06-18 DIAGNOSIS — E782 Mixed hyperlipidemia: Secondary | ICD-10-CM | POA: Diagnosis not present

## 2024-06-18 LAB — COMPREHENSIVE METABOLIC PANEL WITH GFR
ALT: 13 U/L (ref 0–35)
AST: 21 U/L (ref 0–37)
Albumin: 4.1 g/dL (ref 3.5–5.2)
Alkaline Phosphatase: 58 U/L (ref 39–117)
BUN: 19 mg/dL (ref 6–23)
CO2: 31 meq/L (ref 19–32)
Calcium: 9.4 mg/dL (ref 8.4–10.5)
Chloride: 101 meq/L (ref 96–112)
Creatinine, Ser: 1.34 mg/dL — ABNORMAL HIGH (ref 0.40–1.20)
GFR: 39.43 mL/min — ABNORMAL LOW (ref >60.00)
Glucose, Bld: 96 mg/dL (ref 70–99)
Potassium: 3.9 meq/L (ref 3.5–5.1)
Sodium: 140 meq/L (ref 135–145)
Total Bilirubin: 0.7 mg/dL (ref 0.2–1.2)
Total Protein: 7.4 g/dL (ref 6.0–8.3)

## 2024-06-18 NOTE — Progress Notes (Unsigned)
 Patient ID: Jordan Ware, female    DOB: 04-22-1951, 73 y.o.   MRN: [id number]   Assessment & Plan:  Immunization due -     Flu vaccine HIGH DOSE PF(Fluzone Trivalent)  History of melanoma  Morbid obesity (HCC), BMI > 35 with comorbid conditions  Stage 3b chronic kidney disease (CKD) (HCC) -     Comprehensive metabolic panel with GFR  Essential hypertension      Assessment & Plan Atrial Fibrillation Chronic atrial fibrillation well-controlled with Xarelto  and nebivolol . No symptoms of palpitations, chest pain, or dyspnea. - Continue Xarelto  15 mg daily for anticoagulation - Continue nebivolol  20 mg daily for rate control  Chronic Insomnia and Anxiety Disorder Chronic insomnia and anxiety well-managed with Lexapro  and Xanax . Melatonin 10 mg nightly aids sleep. - Continue Lexapro  20 mg daily - Continue Xanax  0.5 mg as needed for sleep - Continue melatonin 10 mg nightly  Chronic Kidney Disease Chronic kidney disease with decreased kidney function. Avoidance of NSAIDs recommended. Potassium slightly low. - Rechecked kidney function today - Avoid NSAIDs - Monitor potassium levels  Morbid Obesity BMI of 40.77. Weight loss of 6 pounds since April due to portion control. - Continue portion control for weight management  Hyperlipidemia Well-managed on current medications. - Continue current medications for hyperlipidemia  History of Malignant Melanoma of Skin Malignant melanoma excised four years ago. Previous dermatology follow-up noted. - Recommended dermatology follow-up every couple of years for skin checks  General Health Maintenance Received high-dose influenza vaccine. No new health concerns reported. - Continue routine health maintenance      Return in about 6 months (around 12/17/2024) for recheck/follow-up.    Subjective:    Chief Complaint  Patient presents with   Medical Management of Chronic Issues    6 month follow up for  hypertension. Started the JARDIANCE  6 months ago and is interested in how it has her blood work will look.      HPI Discussed the use of AI scribe software for clinical note transcription with the patient, who gave verbal consent to proceed.  History of Present Illness Jordan Ware is a 73 year old female with anxiety, chronic insomnia, and atrial fibrillation who presents for her six month follow-up.  She takes Lexapro  20 mg daily for anxiety. She takes Xanax  0.5 mg as needed for sleep and has recently started taking melatonin 10 mg, which has improved her sleep, allowing her to sleep a couple of hours at night. She mentions that if she gets a solid three or four hours of sleep, her legs feel better.  She has a history of atrial fibrillation and takes Xarelto  15 mg daily for anticoagulation and nebivolol  20 mg daily for rate control. No heart flutters, chest pain, or shortness of breath. She does not feel the atrial fibrillation and has not experienced any racing heart.  She was started on Jardiance  10 mg daily after her last labs in April showed decreased kidney function. She reports more trips to the bathroom since starting Jardiance . She avoids NSAIDs and occasionally takes Tylenol .  She has a history of melanoma, with a spot removed from her arm about four years ago. She had another suspicious spot removed, which was not melanoma, and a lesion on her face treated with liquid nitrogen.  No memory concerns and mentions that her daughter relies on her to remember things. She has lost six pounds since earlier this year. She does not engage in much physical activity due to  difficulty with walking, but she is mindful of her dietary intake.     Past Medical History:  Diagnosis Date   Anxiety 06/05/2017   Atrial fibrillation (HCC)    Atypical mole 10/21/2020   Left Lower Arm (Moderate - Dr. Isaiah Geralds) [facility]   Diverticulosis    Dysthymia 06/05/2017   Gallstones 1998    HLD (hyperlipidemia) 06/05/2017   Hypertension    Internal hemorrhoids    Kidney stones    Tubular adenoma of colon     Past Surgical History:  Procedure Laterality Date   APPENDECTOMY     CHOLECYSTECTOMY     partial   HERNIA REPAIR  2005, 2010   kidney stone Right 10/01/2021    Family History  Problem Relation Age of Onset   Breast cancer Mother 16   Colon cancer Mother 74   Prostate cancer Brother    Pancreatic cancer Maternal Grandmother    Rectal cancer Neg Hx    Esophageal cancer Neg Hx    Stomach cancer Neg Hx     Social History   Tobacco Use   Smoking status: Never   Smokeless tobacco: Never  Vaping Use   Vaping status: Never Used  Substance Use Topics   Alcohol use: No   Drug use: No     No Known Allergies  Review of Systems NEGATIVE UNLESS OTHERWISE INDICATED IN HPI      Objective:     BP 122/82 (BP Location: Left Arm, Patient Position: Sitting, Cuff Size: Normal)   Pulse 71   Temp (!) 97.2 F (36.2 C) (Tympanic)   Ht 5' 5 (1.651 m)   Wt 245 lb (111.1 kg)   SpO2 96%   BMI 40.77 kg/m   Wt Readings from Last 3 Encounters:  06/18/24 245 lb (111.1 kg)  11/30/23 249 lb (112.9 kg)  11/13/23 251 lb (113.9 kg)    BP Readings from Last 3 Encounters:  06/18/24 122/82  11/30/23 138/88  11/13/23 132/82     Physical Exam Vitals and nursing note reviewed.  Constitutional:      Appearance: Normal appearance. She is obese. She is not toxic-appearing.  HENT:     Head: Normocephalic and atraumatic.  Eyes:     Extraocular Movements: Extraocular movements intact.     Conjunctiva/sclera: Conjunctivae normal.     Pupils: Pupils are equal, round, and reactive to light.  Cardiovascular:     Rate and Rhythm: Normal rate. Rhythm irregular.     Pulses: Normal pulses.     Heart sounds: Normal heart sounds.  Pulmonary:     Effort: Pulmonary effort is normal.     Breath sounds: Normal breath sounds.  Musculoskeletal:        General: Normal range of  motion.     Cervical back: Normal range of motion and neck supple.     Right lower leg: Edema present.     Left lower leg: Edema present.     Comments: Trace edema BLE's  Skin:    General: Skin is warm and dry.  Neurological:     General: No focal deficit present.     Mental Status: She is alert and oriented to person, place, and time.     Gait: Gait abnormal.  Psychiatric:        Behavior: Behavior normal.             Kearney Evitt M Krishika Bugge, PA-C

## 2024-06-19 NOTE — Patient Instructions (Signed)
  VISIT SUMMARY: You had your six-month follow-up today to review your conditions including anxiety, chronic insomnia, atrial fibrillation, and chronic kidney disease. Your medications are effectively managing your symptoms, and you have experienced some weight loss due to portion control. We also discussed your history of melanoma and the importance of regular dermatology check-ups.  YOUR PLAN: ATRIAL FIBRILLATION: Your chronic atrial fibrillation is well-controlled with your current medications. -Continue taking Xarelto  15 mg daily for anticoagulation. -Continue taking nebivolol  20 mg daily for rate control.  CHRONIC INSOMNIA AND ANXIETY DISORDER: Your chronic insomnia and anxiety are well-managed with your current medications and melatonin. -Continue taking Lexapro  20 mg daily. -Continue taking Xanax  0.5 mg as needed for sleep. -Continue taking melatonin 10 mg nightly.  CHRONIC KIDNEY DISEASE: You have chronic kidney disease with decreased kidney function. Your potassium levels are slightly low. -We rechecked your kidney function today. -Avoid NSAIDs. -Monitor your potassium levels.  MORBID OBESITY: Your BMI is 40.77, and you have lost 6 pounds since April due to portion control. -Continue portion control for weight management.  HYPERLIPIDEMIA: Your hyperlipidemia is well-managed with your current medications. -Continue your current medications for hyperlipidemia.  HISTORY OF MALIGNANT MELANOMA OF SKIN: You had a malignant melanoma excised four years ago. -Follow up with dermatology every couple of years for skin checks.  GENERAL HEALTH MAINTENANCE: You received a high-dose influenza vaccine and reported no new health concerns. -Continue routine health maintenance.                      Contains text generated by Abridge.                                 Contains text generated by Abridge.

## 2024-06-23 ENCOUNTER — Ambulatory Visit: Payer: Self-pay | Admitting: Physician Assistant

## 2024-07-01 ENCOUNTER — Other Ambulatory Visit: Payer: Self-pay | Admitting: Physician Assistant

## 2024-07-01 DIAGNOSIS — F419 Anxiety disorder, unspecified: Secondary | ICD-10-CM

## 2024-07-01 NOTE — Telephone Encounter (Signed)
 Please review.Last OV was 06/18/24. Last Anxiety appointment was 03/22/2024

## 2024-07-16 ENCOUNTER — Other Ambulatory Visit: Payer: Self-pay | Admitting: Physician Assistant

## 2024-07-16 DIAGNOSIS — I4819 Other persistent atrial fibrillation: Secondary | ICD-10-CM

## 2024-08-21 ENCOUNTER — Other Ambulatory Visit: Payer: Self-pay | Admitting: Physician Assistant

## 2024-09-18 ENCOUNTER — Encounter: Payer: Self-pay | Admitting: Physician Assistant

## 2024-09-18 ENCOUNTER — Other Ambulatory Visit: Payer: Self-pay | Admitting: Physician Assistant

## 2024-09-18 ENCOUNTER — Other Ambulatory Visit: Payer: Self-pay

## 2024-09-18 DIAGNOSIS — F341 Dysthymic disorder: Secondary | ICD-10-CM

## 2024-09-18 MED ORDER — ROSUVASTATIN CALCIUM 5 MG PO TABS: 5.0000 mg | ORAL_TABLET | Freq: Every day | ORAL | 3 refills | Status: AC

## 2024-12-19 ENCOUNTER — Ambulatory Visit: Admitting: Physician Assistant
